# Patient Record
Sex: Female | Born: 1935 | Race: White | Hispanic: No | Smoking: Never smoker
Health system: Southern US, Community
[De-identification: ages and names within clinical notes are randomized; demographics above are authoritative.]

## PROBLEM LIST (undated history)

## (undated) DIAGNOSIS — K922 Gastrointestinal hemorrhage, unspecified: Secondary | ICD-10-CM

## (undated) DIAGNOSIS — Z9981 Dependence on supplemental oxygen: Secondary | ICD-10-CM

## (undated) DIAGNOSIS — F03A0 Unspecified dementia, mild, without behavioral disturbance, psychotic disturbance, mood disturbance, and anxiety: Secondary | ICD-10-CM

## (undated) DIAGNOSIS — I10 Essential (primary) hypertension: Secondary | ICD-10-CM

## (undated) DIAGNOSIS — C7800 Secondary malignant neoplasm of unspecified lung: Secondary | ICD-10-CM

## (undated) DIAGNOSIS — C189 Malignant neoplasm of colon, unspecified: Secondary | ICD-10-CM

## (undated) DIAGNOSIS — F32A Depression, unspecified: Secondary | ICD-10-CM

## (undated) DIAGNOSIS — K621 Rectal polyp: Secondary | ICD-10-CM

## (undated) DIAGNOSIS — I071 Rheumatic tricuspid insufficiency: Secondary | ICD-10-CM

## (undated) DIAGNOSIS — Z66 Do not resuscitate: Secondary | ICD-10-CM

## (undated) DIAGNOSIS — F0390 Unspecified dementia without behavioral disturbance: Secondary | ICD-10-CM

## (undated) DIAGNOSIS — I4821 Permanent atrial fibrillation: Secondary | ICD-10-CM

## (undated) DIAGNOSIS — I872 Venous insufficiency (chronic) (peripheral): Secondary | ICD-10-CM

## (undated) DIAGNOSIS — I340 Nonrheumatic mitral (valve) insufficiency: Secondary | ICD-10-CM

## (undated) DIAGNOSIS — Z9289 Personal history of other medical treatment: Secondary | ICD-10-CM

## (undated) DIAGNOSIS — I272 Pulmonary hypertension, unspecified: Secondary | ICD-10-CM

## (undated) DIAGNOSIS — F329 Major depressive disorder, single episode, unspecified: Secondary | ICD-10-CM

## (undated) HISTORY — DX: Unspecified dementia, mild, without behavioral disturbance, psychotic disturbance, mood disturbance, and anxiety: F03.A0

## (undated) HISTORY — DX: Rheumatic tricuspid insufficiency: I07.1

## (undated) HISTORY — DX: Unspecified dementia without behavioral disturbance: F03.90

## (undated) HISTORY — DX: Personal history of other medical treatment: Z92.89

## (undated) HISTORY — DX: Venous insufficiency (chronic) (peripheral): I87.2

## (undated) HISTORY — DX: Major depressive disorder, single episode, unspecified: F32.9

## (undated) HISTORY — PX: TONSILLECTOMY: SUR1361

## (undated) HISTORY — DX: Nonrheumatic mitral (valve) insufficiency: I34.0

## (undated) HISTORY — DX: Depression, unspecified: F32.A

---

## 2000-05-20 ENCOUNTER — Encounter: Admission: RE | Admit: 2000-05-20 | Discharge: 2000-05-20 | Payer: Self-pay | Admitting: Orthopedic Surgery

## 2000-05-20 ENCOUNTER — Encounter: Payer: Self-pay | Admitting: Orthopedic Surgery

## 2003-04-27 ENCOUNTER — Encounter: Admission: RE | Admit: 2003-04-27 | Discharge: 2003-04-27 | Payer: Self-pay | Admitting: Orthopedic Surgery

## 2003-04-27 ENCOUNTER — Encounter: Payer: Self-pay | Admitting: Orthopedic Surgery

## 2004-10-28 HISTORY — PX: CHOLECYSTECTOMY: SHX55

## 2011-07-17 ENCOUNTER — Other Ambulatory Visit (HOSPITAL_COMMUNITY): Payer: Self-pay | Admitting: Internal Medicine

## 2011-07-17 DIAGNOSIS — R599 Enlarged lymph nodes, unspecified: Secondary | ICD-10-CM

## 2011-07-19 ENCOUNTER — Other Ambulatory Visit (HOSPITAL_COMMUNITY): Payer: Self-pay

## 2011-07-23 ENCOUNTER — Ambulatory Visit (HOSPITAL_COMMUNITY)
Admission: RE | Admit: 2011-07-23 | Discharge: 2011-07-23 | Disposition: A | Discharge status: Home / Self Care | Payer: Medicare Other | Source: Ambulatory Visit | Attending: Internal Medicine | Admitting: Internal Medicine

## 2011-07-23 DIAGNOSIS — R599 Enlarged lymph nodes, unspecified: Secondary | ICD-10-CM

## 2011-07-23 DIAGNOSIS — R220 Localized swelling, mass and lump, head: Secondary | ICD-10-CM | POA: Insufficient documentation

## 2011-09-10 ENCOUNTER — Other Ambulatory Visit (HOSPITAL_COMMUNITY)
Admission: RE | Admit: 2011-09-10 | Discharge: 2011-09-10 | Disposition: A | Discharge status: Home / Self Care | Payer: Medicare Other | Source: Ambulatory Visit | Attending: Obstetrics and Gynecology | Admitting: Obstetrics and Gynecology

## 2011-09-10 DIAGNOSIS — Z124 Encounter for screening for malignant neoplasm of cervix: Secondary | ICD-10-CM | POA: Insufficient documentation

## 2011-10-29 HISTORY — PX: TOTAL KNEE ARTHROPLASTY: SHX125

## 2012-03-16 ENCOUNTER — Ambulatory Visit (HOSPITAL_COMMUNITY)
Admission: RE | Admit: 2012-03-16 | Discharge: 2012-03-16 | Disposition: A | Discharge status: Home / Self Care | Payer: Medicare Other | Source: Ambulatory Visit | Attending: Internal Medicine | Admitting: Internal Medicine

## 2012-03-16 DIAGNOSIS — R262 Difficulty in walking, not elsewhere classified: Secondary | ICD-10-CM | POA: Insufficient documentation

## 2012-03-16 DIAGNOSIS — M6281 Muscle weakness (generalized): Secondary | ICD-10-CM | POA: Insufficient documentation

## 2012-03-16 DIAGNOSIS — M25569 Pain in unspecified knee: Secondary | ICD-10-CM | POA: Insufficient documentation

## 2012-03-16 DIAGNOSIS — IMO0001 Reserved for inherently not codable concepts without codable children: Secondary | ICD-10-CM | POA: Insufficient documentation

## 2012-03-16 NOTE — Evaluation (Signed)
Physical Therapy Evaluation  Patient Details  Name: Rama Sorci MRN: [id number] Date of Birth: 1934-06-11  Today's Date: 03/16/2012 Time: 1520-1610 PT Time Calculation (min): 50 min  Visit#: 1  of 8   Re-eval: 04/15/12 Assessment Diagnosis: Hx of falls, difficulty walking Prior Therapy: HH until the end of March.  Authorization: Medicare  Authorization Time Period:    Authorization Visit#:   of     Past Medical History: No past medical history on file. Past Surgical History: No past surgical history on file.  Subjective Symptoms/Limitations Symptoms: Ms. Riesen states that she had total knee surgery February 26th and was doing pretty well.  She had therapy until the end of March through home health.  She states she was doing well until the last month.  She is now having difficulty getting up or down and if she does to much she falls.  She is currently using a cane.  Prior to the surgery she was not using a cane.  She states she does not have much pain she just feels like she has weakness.  She is being referred to PT to improve her safety and functional ability. How long can you stand comfortably?: She is able to stand for ten to fifteen minutes. How long can you walk comfortably?: She is currently walking with a cane and is able to walk for ten minutes. Pain Assessment Currently in Pain?: No/denies  Precautions/Restrictions  Precautions Precautions: Fall  Prior Functioning  Home Living Lives With: Other (Comment) (Brother) Home Access: Stairs to enter Secretary/administrator of Steps: 2 Entrance Stairs-Rails: Right Home Layout: Two level;Laundry or work area in basement Prior Function Vocation: Retired Leisure: Hobbies-yes (Comment) Comments: Pt enjoyed walking (Enjoyed working at [employer] and walking)  Cognition/Observation Cognition Overall Cognitive Status: Appears within functional limits for tasks assessed   Assessment RLE Strength Right Hip Flexion: 3/5 Right Hip  Extension: 3/5 Right Hip ABduction: 3/5 Right Hip ADduction: 3/5 Right Knee Flexion: 5/5 Right Knee Extension: 5/5 Right Ankle Dorsiflexion: 3/5 LLE Strength Left Hip Flexion: 4/5 Left Hip Extension: 4/5 Left Hip ABduction: 3/5 Left Hip ADduction: 3+/5 Left Knee Flexion: 3+/5 Left Knee Extension: 5/5 Left Ankle Dorsiflexion: 3/5  Exercise/Treatments Mobility/Balance  Berg Balance Test Total Score: 39/56      Exercises   Supine Hip Adduction Isometric: 5 reps Bridges: 5 reps Straight Leg Raises: 5 reps Sidelying Hip ABduction: 5 reps Prone  Hamstring Curl: 5 reps Hip Extension: 5 reps    Physical Therapy Assessment and Plan PT Assessment and Plan Clinical Impression Statement: Pt with history of falling who has decreased leg strength and decreased balance who will benefit from skilled PT to improve safety and quality of life. Pt will benefit from skilled therapeutic intervention in order to improve on the following deficits: Abnormal gait;Decreased balance;Difficulty walking;Decreased activity tolerance;Decreased strength Rehab Potential: Good PT Frequency: Min 2X/week PT Duration: 4 weeks PT Treatment/Interventions: Gait training;Therapeutic activities;Therapeutic exercise;Neuromuscular re-education PT Plan: begin Bike, tandem gait, retro gt, SLS, rockerboard, cone rotation on foam, heel raises and minisquats next treatment progress to balance beam, hurdles ....    Goals Home Exercise Program Pt will Perform Home Exercise Program: Independently PT Short Term Goals Time to Complete Short Term Goals: 2 weeks PT Short Term Goal 1: Pt to be able to walk for 25 minutes without stopping PT Long Term Goals Time to Complete Long Term Goals: 4 weeks PT Long Term Goal 1: able  to ambulate without a cane PT Long Term Goal 2:  able to walk for 40 minutes without stopping Long Term Goal 3: Pt to be able to get out into her yard without feeling fearful Long Term Goal 4:  patient to be able to come sit to stand 6 times in 30 seconds. PT Long Term Goal 5: Pt strength to be increased by one grade. Additional PT Long Term Goals?: Yes PT Long Term Goal 6: Pt to have not fallen in 2 weeks.  Problem List There is no problem list on file for this patient.   PT - End of Session Equipment Utilized During Treatment: Gait belt Activity Tolerance: Patient tolerated treatment well General Behavior During Session: [facility] for tasks performed Cognition: [facility] for tasks performed PT Plan of Care PT Home Exercise Plan: given Consulted and Agree with Plan of Care: Patient  GP  Functional Reporting Modifier  Current Status  [phone] - Mobility: Walking & Moving Around CK - At least 40% but less than 60% impaired, limited or restricted  Goal Status   G8979 - Mobility: [place] & Moving Around CI - At least 1% but less than 20% impaired, limited or restricted  I chose [phone] due to the fact that the patient is having trouble walking. CK based partly on mm testing of 3/5; partly due to hx of falling using cane and due to berg.  CI due to pt age.  IRUSSELL,CINDY  03/16/2012, 4:42 PM  Physician Documentation Your signature is required to indicate approval of the treatment plan as stated above.  Please sign and either send electronically or make a copy of this report for your files and return this physician signed original.   Please mark one 1.__approve of plan  2. ___approve of plan with the following conditions.   ______________________________                                                          _____________________ Physician Signature                                                                                                             Date

## 2012-03-19 ENCOUNTER — Ambulatory Visit (HOSPITAL_COMMUNITY)
Admission: RE | Admit: 2012-03-19 | Discharge: 2012-03-19 | Disposition: A | Discharge status: Home / Self Care | Payer: Medicare Other | Source: Ambulatory Visit | Attending: Internal Medicine | Admitting: Internal Medicine

## 2012-03-19 NOTE — Progress Notes (Signed)
Physical Therapy Treatment Patient Details  Name: Tamara Mitchell MRN: [id number] Date of Birth: April 01, 1934  Today's Date: 03/19/2012 Time: 7253-6644 PT Time Calculation (min): 50 min  Visit#: 2  of 8   Re-eval: 04/15/12 Charges: Therex x 10' NMR x 30'  Authorization: Medicare    Subjective: Symptoms/Limitations Symptoms: Pt reports HEP compliance. Pain Assessment Currently in Pain?: Yes Pain Score:   4 Pain Location: Knee Pain Orientation: Left   Exercise/Treatments Aerobic Stationary Bike: 8'@2 .0 seat 7 Standing Heel Raises: 10 reps;Limitations Heel Raises Limitations: Toe raises x 10 Functional Squat: 10 reps SLS: Unable to remove hands; 10" B with 1 finger assisst Other Standing Knee Exercises: Standing with feet together; standing with feet together w/eyes closed x 30"  Balance Exercises Stationary Bike: 8'@2 .0 seat 7 Tandem Walking: Limitations Tandem Walking Limitations: Attempted but unable this session Rotation with Cones: 1RT on foam Tandem Stance: Limitations Tandem Stance Limitations: unable to hold true tandem stance w/o assist; Widened tandem stance x30" B  Physical Therapy Assessment and Plan PT Assessment and Plan Clinical Impression Statement: Pt ambulates with wide BOS secondary to impaired balance. Attempted tandem gait but pt is unable to complete this activity at this point. Began tandem stance but pt unable to maintain without assistance. Pt is without complaint throughout session PT Plan: Continue to progress strength and balance per PT POC.     Problem List There is no problem list on file for this patient.   PT - End of Session Activity Tolerance: Patient tolerated treatment well General Behavior During Session: [facility] for tasks performed Cognition: [facility] for tasks performed  Seth Bake, PTA 03/19/2012, 5:57 PM

## 2012-03-24 ENCOUNTER — Ambulatory Visit (HOSPITAL_COMMUNITY)
Admission: RE | Admit: 2012-03-24 | Discharge: 2012-03-24 | Disposition: A | Discharge status: Home / Self Care | Payer: Medicare Other | Source: Ambulatory Visit | Attending: Internal Medicine | Admitting: Internal Medicine

## 2012-03-24 NOTE — Progress Notes (Signed)
Physical Therapy Treatment Patient Details  Name: Tamara Mitchell MRN: [id number] Date of Birth: September 26, 1934  Today's Date: 03/24/2012 Time: 4098-1191 PT Time Calculation (min): 40 min  Visit#: 3  of 8   Re-eval: 04/15/12 Charges: NMR x 8' Therex x 22'  Subjective: Symptoms/Limitations Symptoms: I left my cane in the car on purpose. Pain Assessment Currently in Pain?: No/denies   Exercise/Treatments Aerobic Stationary Bike: 8'@2 .0 seat 7 Standing Heel Raises: 15 reps Heel Raises Limitations: Toe raises x 15 Functional Squat: 10 reps Seated Other Seated Knee Exercises: sit to stand x 10 w/o UE assist Supine Hip Adduction Isometric: 10 reps Bridges: 10 reps Straight Leg Raises: 10 reps Sidelying Hip ABduction: 10 reps Prone  Hamstring Curl: 10 reps Hip Extension: 15 reps   Balance Exercises Stationary Bike: 8'@2 .0 seat 7 Tandem Stance: Limitations Tandem Stance Limitations: true tandem with min-mod assist x 30" B  Physical Therapy Assessment and Plan PT Assessment and Plan Clinical Impression Statement: Pt ambulates into therapy without and AD. Pt reports that she left her cane in the car on purpose. Pt advised to use her cane anytime she is going out of the house due to her lack of balance. Pt able to achieve tandem stance with increased ease this session but still requires min-mod assist to maintain. Pt is without complaint throughout session. PT Plan: Continue to progress per PT POC.     Problem List There is no problem list on file for this patient.   PT - End of Session Activity Tolerance: Patient tolerated treatment well General Behavior During Session: [facility] for tasks performed Cognition: [facility] for tasks performed   Seth Bake, PTA 03/24/2012, 5:06 PM

## 2012-03-26 ENCOUNTER — Ambulatory Visit (HOSPITAL_COMMUNITY)
Admission: RE | Admit: 2012-03-26 | Discharge: 2012-03-26 | Disposition: A | Discharge status: Home / Self Care | Payer: Medicare Other | Source: Ambulatory Visit | Attending: Internal Medicine | Admitting: Internal Medicine

## 2012-03-26 NOTE — Progress Notes (Signed)
Physical Therapy Treatment Patient Details  Name: Kemani Heidel MRN: [id number] Date of Birth: 04-14-1934  Today's Date: 03/26/2012 Time: 2130-8657 PT Time Calculation (min): 36 min Visit#: 4  of 8   Re-eval: 04/15/12 Authorization: Medicare  Charges:  NMR 12', therex 18'  Subjective: Symptoms/Limitations Symptoms: Pt. reports no pain or difficulties. Pain Assessment Currently in Pain?: No/denies   Exercise/Treatments Aerobic Stationary Bike: 6'@3 .0 seat 6 Standing Heel Raises: 15 reps Heel Raises Limitations: Toe raises x 15 Functional Squat: 15 reps SLS: max of 3:  R:3", L:2" Other Standing Knee Exercises: true tandem 30" each  Seated Other Seated Knee Exercises: sit to stand x 10 w/o UE assist Supine Straight Leg Raises: 10 reps;Both Sidelying Hip ABduction: 15 reps;Both Prone  Hamstring Curl: 15 reps Hip Extension: 15 reps     Physical Therapy Assessment and Plan PT Assessment and Plan Clinical Impression Statement: Able to complete all activities today with only 1 resting break.  Pt. able to maintain brief balance with SLS for B LE's.  Pt. requires cues to perform exercises slowly and controlled. PT Plan: Progress balance activities; attempt tandem, retro and side stepping next visit without AD.     PT - End of Session Equipment Utilized During Treatment: Gait belt Activity Tolerance: Patient tolerated treatment well General Behavior During Session: [facility] for tasks performed Cognition: [facility] for tasks performed  GP No functional reporting required  Lurena Nida, PTA/CLT 03/26/2012, 4:00 PM

## 2012-03-31 ENCOUNTER — Ambulatory Visit (HOSPITAL_COMMUNITY)
Admission: RE | Admit: 2012-03-31 | Discharge: 2012-03-31 | Disposition: A | Discharge status: Home / Self Care | Payer: Medicare Other | Source: Ambulatory Visit | Attending: Internal Medicine | Admitting: Internal Medicine

## 2012-03-31 DIAGNOSIS — M25569 Pain in unspecified knee: Secondary | ICD-10-CM | POA: Insufficient documentation

## 2012-03-31 DIAGNOSIS — IMO0001 Reserved for inherently not codable concepts without codable children: Secondary | ICD-10-CM | POA: Insufficient documentation

## 2012-03-31 DIAGNOSIS — R262 Difficulty in walking, not elsewhere classified: Secondary | ICD-10-CM | POA: Insufficient documentation

## 2012-03-31 DIAGNOSIS — M6281 Muscle weakness (generalized): Secondary | ICD-10-CM | POA: Insufficient documentation

## 2012-03-31 NOTE — Progress Notes (Signed)
Physical Therapy Treatment Patient Details  Name: Tamara Mitchell MRN: [id number] Date of Birth: 1934-07-15  Today's Date: 03/31/2012 Time: 7829-5621 PT Time Calculation (min): 41 min Charge: NMR 30 min therex 11 min  Visit#: 5  of 8   Re-eval: 04/15/12 Assessment Diagnosis: Hx of falls, difficulty walking Prior Therapy: HH until the end of March.  Authorization: Medicare  Authorization Time Period: G code at initial eval current status: CK, goal status: CI  Authorization Visit#: 5  of 8    Subjective: Symptoms/Limitations Symptoms: Pt entered dept ambulating with SPC with big smile on face, reported she vaccumed carpet yesterday and was suprised by how good she did with activity.  No pain today. Pain Assessment Currently in Pain?: No/denies  Precautions/Restrictions  Precautions Precautions: Fall  Exercise/Treatments Aerobic Stationary Bike: 6'@3 .5 seat 6 Standing Heel Raises: 20 reps;Limitations Heel Raises Limitations: Toe raises x 20 Functional Squat: 15 reps;Limitations Functional Squat Limitations: manual cueing for correct mechanics/form and vc-ing to slow down SLS: max of 3:  R:3", L:2" Seated Other Seated Knee Exercises: sit to stand x 10 w/o UE assist Other Seated Knee Exercises: Heel rolling out 10x10"  Balance Exercises Stationary Bike: 6'@3 .5 seat 6 Tandem Walking: 1 round trip;Limitations Tandem Walking Limitations: Mod assistance with several LOB episodes, unable to complete true tandem gait without tapping sides Tandem Stance: Limitations Tandem Stance Limitations: true tandem with min-mod assist 3 x 30" B  Physical Therapy Assessment and Plan PT Assessment and Plan Clinical Impression Statement: Session focus on improving balance. Noted decreased proprioception, pt. required visual cueing to know when in tandem stance, pt was able to tandem stance independently with min assistance with LOB episdoes.  Progressed to tandem gait with max difficulty, pt unable  to tandem gait without tapping LE on sides, vc-ing required constantly to look up.  Therex required cueing to perform exercises with correct form, slowly and controlled.  Added heels out to increase hip IR strength to improve gait mechanics. PT Plan: Continue with balance training with tandem stance, tandem gait, retro gait and add side stepping next visit with no AD.    Goals    Problem List There is no problem list on file for this patient.   PT - End of Session Equipment Utilized During Treatment: Gait belt Activity Tolerance: Patient tolerated treatment well General Behavior During Session: [facility] for tasks performed Cognition: [facility] for tasks performed  GP No functional reporting required  Juel Burrow, PTA 03/31/2012, 4:15 PM

## 2012-04-02 ENCOUNTER — Ambulatory Visit (HOSPITAL_COMMUNITY)
Admission: RE | Admit: 2012-04-02 | Discharge: 2012-04-02 | Disposition: A | Discharge status: Home / Self Care | Payer: Medicare Other | Source: Ambulatory Visit | Attending: Internal Medicine | Admitting: Internal Medicine

## 2012-04-02 NOTE — Progress Notes (Signed)
Physical Therapy Treatment Patient Details  Name: Tamara Mitchell MRN: [id number] Date of Birth: Mar 22, 1934  Today's Date: 04/02/2012 Time: 1523-1608 PT Time Calculation (min): 45 min  Visit#: 6  of 8   Re-eval: 04/15/12    Authorization: medicare  Authorization Time Period: eval G code CK goal CI  Authorization Visit#:   of     Subjective: Symptoms/Limitations Symptoms: Pt states she has been doing her exercises at home without difficulty     Exercise/Treatments Balance Exercises Standing Tandem Stance: Eyes open;3 reps;30 secs Balance Beam: x2 RT Retro Gait: 2 reps Sidestepping: 2 reps Numbers 1-15: 1 rep Marching: 10 reps Heel Raises: 15 reps Sit to Stand: Standard surface;Limitations Sit to Stand Limitations: 5x   Physical Therapy Assessment and Plan PT Assessment and Plan Clinical Impression Statement: Pt continues to relax into her Y ligament with shoulders behind her base of support.  Worked on standing with shoulders in line with hips.  Added multiple balance activities with [facility] assist needed for balance. Rehab Potential: Good PT Frequency: Min 2X/week PT Plan: Begin cone rotation next treatment...may hold bike to last so there is enough time to complete activites.    Goals   Problem List There is no problem list on file for this patient.  PT - End of Session Equipment Utilized During Treatment: Gait belt Activity Tolerance: Patient limited by fatigue (Pt needed to take 3 short resting breaks with therapy sessio) General Behavior During Session: [facility] for tasks performed Cognition: [facility] for tasks performed  GP No functional reporting required  Shanigua Gibb,CINDY 04/02/2012, 4:13 PM

## 2012-04-07 ENCOUNTER — Ambulatory Visit (HOSPITAL_COMMUNITY)
Admission: RE | Admit: 2012-04-07 | Discharge: 2012-04-07 | Disposition: A | Discharge status: Home / Self Care | Payer: Medicare Other | Source: Ambulatory Visit | Attending: Internal Medicine | Admitting: Internal Medicine

## 2012-04-07 NOTE — Progress Notes (Signed)
Physical Therapy Treatment Patient Details  Name: Tamara Mitchell MRN: [id number] Date of Birth: 06/01/1934  Today's Date: 04/07/2012 Time: 1308-6578 PT Time Calculation (min): 45 min  Visit#: 7  of 8   Re-eval: 04/15/12 Charges: NMR x 39'  Authorization: medicare  Authorization Time Period: eval G code CK goal CI  Authorization Visit#: 7  of 8    Subjective: Symptoms/Limitations Symptoms: I'm not having any pain. I just feel weak all over. I'm afraid I'm not getting any better. Pain Assessment Currently in Pain?: No/denies  Exercise/Treatments  Balance Exercises Standing Tandem Stance: 3 reps;30 secs;Eyes open Tandem Gait: 2 reps;Forward;Limitations Tandem Gait Limitations: B HHA Retro Gait: 2 reps Numbers 1-15: 1 rep;Balance Beam Heel Raises: 15 reps Sit to Stand: Standard surface Sit to Stand Limitations: 5x w/o UE assist with vc's for looking up   Physical Therapy Assessment and Plan PT Assessment and Plan Clinical Impression Statement: Pt comes into therapy very discouraged about her progress. Pt educated on the time it takes to improve balance. Pt able to maintain tandem stance with CGA for 30" at a time. This is a huge improvement for pt. Pt realized  ow well she did with this activity and seemed to become more confident. Pt completes all therex with improved proprioceptive awareness. Pt continues to require multimodal cueing for posture. At end of session pt reports:" I feel more confident in myself. I can really tell a difference now." PT Plan: Continue to progress balance per PT POC. Begin cone rotation next session.     Problem List There is no problem list on file for this patient.   PT - End of Session Activity Tolerance: Patient tolerated treatment well General Behavior During Session: [facility] for tasks performed Cognition: [facility] for tasks performed   Seth Bake, PTA 04/07/2012, 4:29 PM

## 2012-04-10 ENCOUNTER — Ambulatory Visit (HOSPITAL_COMMUNITY)
Admission: RE | Admit: 2012-04-10 | Discharge: 2012-04-10 | Disposition: A | Discharge status: Home / Self Care | Payer: Medicare Other | Source: Ambulatory Visit | Attending: Internal Medicine | Admitting: Internal Medicine

## 2012-04-10 NOTE — Progress Notes (Signed)
Physical Therapy Re-evaluation  Patient Details  Name: Tamara Mitchell MRN: [id number] Date of Birth: 15-Nov-1933  Today's Date: 04/10/2012 Time: 1520-1600 PT Time Calculation (min): 40 min  Visit#: 8  of 16   Re-eval: 05/08/12 Charges: PPT x 20' MMT x 1 Self care x 10'   Authorization: medicare  Authorization Visit#: 8  of 16    Past Medical History: No past medical history on file. Past Surgical History: No past surgical history on file.  Subjective Symptoms/Limitations Symptoms: I feel like my balance has improved and I feel more sure of myself. Pain Assessment Currently in Pain?: No/denies   Assessment RLE Strength Right Hip Flexion:  (4+/5 was 3/5) Right Hip Extension: 4/5 (was 3/5) Right Hip ABduction: 4/5 Right Hip ADduction: 4/5 (was 3/5) Right Knee Flexion: 5/5 Right Knee Extension: 5/5 Right Ankle Dorsiflexion: 5/5 (was 3/5) LLE Strength Left Hip Flexion: 5/5 (was 4/5) Left Hip Extension:  (4+/5 was 4/5) Left Hip ABduction:  (4+/5 was 3/5) Left Hip ADduction: 4/5 (3+/5) Left Knee Flexion: 4/5 (was 3+/5) Left Knee Extension: 5/5 Left Ankle Dorsiflexion: 5/5 (was 3/5)  Exercise/Treatments Mobility/Balance  Berg Balance Test Sit to Stand: Able to stand without using hands and stabilize independently Standing Unsupported: Able to stand safely 2 minutes Sitting with Back Unsupported but Feet Supported on Floor or Stool: Able to sit safely and securely 2 minutes Stand to Sit: Sits safely with minimal use of hands Transfers: Able to transfer safely, minor use of hands Standing Unsupported with Eyes Closed: Able to stand 10 seconds with supervision Standing Ubsupported with Feet Together: Able to place feet together independently and stand for 1 minute with supervision From Standing, Reach Forward with Outstretched Arm: Can reach forward >12 cm safely (5") From Standing Position, Pick up Object from Floor: Able to pick up shoe safely and easily From Standing  Position, Turn to Look Behind Over each Shoulder: Looks behind from both sides and weight shifts well Turn 360 Degrees: Able to turn 360 degrees safely but slowly Standing Unsupported, Alternately Place Feet on Step/Stool: Needs assistance to keep from falling or unable to try Standing Unsupported, One Foot in Front: Able to plae foot ahead of the other independently and hold 30 seconds Standing on One Leg: Unable to try or needs assist to prevent fall Total Score: 42     Physical Therapy Assessment and Plan PT Assessment and Plan Clinical Impression Statement: Pt has progressed well with therapy but continues to present with gait and balance deficits. Pt's strength and BERG score have improved. Pt feels that she has improved 70% since beginning therapy/ Pt would benefit from continuing therapy to reduce risk of falls and improve QOL. PT Plan: Recommend to PT to continue 2x/wk x 4 wks.    Goals Home Exercise Program Pt will Perform Home Exercise Program: Independently PT Short Term Goals Time to Complete Short Term Goals: 2 weeks PT Short Term Goal 1: Pt to be able to walk for 25 minutes without stopping PT Short Term Goal 1 - Progress: Progressing toward goal (Pt can walk no longer than 10') PT Long Term Goals Time to Complete Long Term Goals: 4 weeks PT Long Term Goal 1: able to ambulate without a cane PT Long Term Goal 1 - Progress: Progressing toward goal PT Long Term Goal 2: able to walk for 40 minutes without stopping PT Long Term Goal 2 - Progress: Progressing toward goal Long Term Goal 3: Pt to be able to get out into her yard  without feeling fearful Long Term Goal 3 Progress: Met (Pt is no longer fearful as long as she has her cane.) Long Term Goal 4: patient to be able to come sit to stand 6 times in 30 seconds. Long Term Goal 4 Progress: Met (Able to complete 6 STS w/o UE assist in 24".) PT Long Term Goal 5: Pt strength to be increased by one grade. Long Term Goal 5  Progress: Partly met PT Long Term Goal 6: Pt to have not fallen in 2 weeks.  (Met)  Problem List There is no problem list on file for this patient.   PT - End of Session Activity Tolerance: Patient tolerated treatment well General Behavior During Session: [facility] for tasks performed Cognition: [facility] for tasks performed  GP  Functional Reporting Modifier  Current Status  [phone] - Mobility: Walking & Moving Around CJ - At least 20% but less than 40% impaired, limited or restricted  Goal Status  G8979 - Mobility: [place] & Moving Around CI - At least 1% but less than 20% impaired, limited or restricted  Based on MMT and self report.  Antonieta Iba 04/10/2012, 4:53 PM  Physician Documentation Your signature is required to indicate approval of the treatment plan as stated above.  Please sign and either send electronically or make a copy of this report for your files and return this physician signed original.   Please mark one 1.__approve of plan  2. ___approve of plan with the following conditions.   ______________________________                                                          _____________________ Physician Signature                                                                                                             Date

## 2012-04-14 ENCOUNTER — Ambulatory Visit (HOSPITAL_COMMUNITY)
Admission: RE | Admit: 2012-04-14 | Discharge: 2012-04-14 | Disposition: A | Discharge status: Home / Self Care | Payer: Medicare Other | Source: Ambulatory Visit | Attending: Internal Medicine | Admitting: Internal Medicine

## 2012-04-14 NOTE — Progress Notes (Signed)
Physical Therapy Treatment Patient Details  Name: Tamara Mitchell MRN: [id number] Date of Birth: 06/08/1934  Today's Date: 04/14/2012 Time: 1305-1350 PT Time Calculation (min): 45 min  Visit#: 9  of 16   Re-eval: 05/08/12 Charges: Gait x 10' NMR x 30'   Authorization: medicare  Authorization Time Period:    Authorization Visit#: 9  of 16    Subjective: Symptoms/Limitations Symptoms: I'm feeling good but I still feel a little weak. Pain Assessment Currently in Pain?: No/denies   Exercise/Treatments Aerobic Tread Mill: .8x5' with min assist and cueing for stride length and heel-toe pattern Standing Functional Squat: 15 reps Gait Training: in dept with SPC working on posture and narrowing BOS Standing Tandem Stance: 2 reps;30 secs Tandem Gait: 2 reps;Forward;Limitations Tandem Gait Limitations: B HHA Heel Raises: 15 reps;Limitations Heel Raises Limitations: w/o UE assist Toe Raise: 15 reps   Physical Therapy Assessment and Plan PT Assessment and Plan Clinical Impression Statement: Tx focus on improving posture and gait. Began gait training with min assist on TM with constant cueing to increase stride length and put heel down first. Pt presents with trunk extension from weak core. Pt is able to bring pelvis to neutral with cueing to facilitate abdominal contraction but pt is unable to hold this position for long periods of time. Pt reports 0/10 pain at end of session. Rehab Potential: Good PT Frequency: Min 2X/week PT Duration: 4 weeks PT Treatment/Interventions: Gait training;Stair training;Functional mobility training;Therapeutic activities;Therapeutic exercise;Balance training;Patient/family education;Neuromuscular re-education PT Plan: Continue to progress balance, gait, and strength per PT POC.      Problem List There is no problem list on file for this patient.   PT - End of Session Activity Tolerance: Patient tolerated treatment well General Behavior During Session:  [facility] for tasks performed Cognition: [facility] for tasks performed   Seth Bake, PTA 04/14/2012, 2:53 PM

## 2012-04-16 ENCOUNTER — Ambulatory Visit (HOSPITAL_COMMUNITY)
Admission: RE | Admit: 2012-04-16 | Discharge: 2012-04-16 | Disposition: A | Discharge status: Home / Self Care | Payer: Medicare Other | Source: Ambulatory Visit | Attending: Internal Medicine | Admitting: Internal Medicine

## 2012-04-16 NOTE — Progress Notes (Signed)
Physical Therapy Treatment Patient Details  Name: Tamara Mitchell MRN: [id number] Date of Birth: 1934-06-24  Today's Date: 04/16/2012 Time: 1520-1605 PT Time Calculation (min): 45 min  Visit#: 10  of 16   Re-eval: 05/08/12  Charge: gait 23 min NMR 20 min  Authorization: medicare  Authorization Time Period: eval G code CK goal CI   Authorization Visit#: 10  of 16    Subjective: Symptoms/Limitations Symptoms: I'm feeling good today, do feel like I am getting stronger and it's easier to get around the house.  Would like to be able to take my trashcan to the end of my long driveway and bring it back, my brother has been doing it currently.   Pain Assessment Currently in Pain?: No/denies  Objective:   Exercise/Treatments Aerobic Tread Mill: Gait trainer x 8 min @ .30-->.35 cyc/sec with max cueing to increase stride length, posture and heel to toe gait Standing Gait Training: in dept with SPC with mod cueing for proper sequenceing x 15 min Other Standing Knee Exercises: tandem stance 2x 30", 2 RT tandem gait with  Physical Therapy Assessment and Plan PT Assessment and Plan Clinical Impression Statement: This session focus on proper gait mechanics with SPC.  Max cueing required for proper sequence with SPC.  Began gait trainer on treadmill with constant cueing required to increase stride length and heel to toe gait.  Pt with improved gait mechanics noted following gait trainer and correct sequence with SPC at end of session. PT Plan: Continue to progress balance, gait, and strength per PT POC    Goals    Problem List There is no problem list on file for this patient.   PT - End of Session Equipment Utilized During Treatment: Gait belt Activity Tolerance: Patient tolerated treatment well General Behavior During Session: [facility] for tasks performed Cognition: [facility] for tasks performed  GP No functional reporting required  Juel Burrow, PTA 04/16/2012, 6:47 PM

## 2012-04-21 ENCOUNTER — Ambulatory Visit (HOSPITAL_COMMUNITY)
Admission: RE | Admit: 2012-04-21 | Discharge: 2012-04-21 | Disposition: A | Discharge status: Home / Self Care | Payer: Medicare Other | Source: Ambulatory Visit | Attending: Internal Medicine | Admitting: Internal Medicine

## 2012-04-21 NOTE — Progress Notes (Signed)
Physical Therapy Treatment Patient Details  Name: Tamara Mitchell MRN: [id number] Date of Birth: [birth year]  Today's Date: 04/21/2012 Time: 1515-1600 PT Time Calculation (min): 45 min  Visit#: 11  of 16   Re-eval: 05/08/12 Charges: Gait training x 23' Therex 17'    Subjective: Symptoms/Limitations Symptoms: Pt states that all of the exercises are difficulty for her but she knows that she is improving.  Pain Assessment Currently in Pain?: No/denies   Exercise/Treatments Aerobic Tread Mill: Gait trainer x 8 min @ .30-->.35 cyc/sec with max cueing to increase stride length, posture and heel to toe gait Standing Gait Training: in dept with SPC with mod cueing for proper sequenceing Seated Other Seated Knee Exercises: Pelvic tilts A/P R/L on sit disk x10 each Standing Tandem Gait: 1 rep;Retro Tandem Gait Limitations: B HHA Heel Raises: 15 reps;Limitations Heel Raises Limitations: w/o UE assist   Physical Therapy Assessment and Plan PT Assessment and Plan Clinical Impression Statement: Pt continues to require constant vc's to decrease width of BOS and keep pelvis in neutral. Began pelvic tilts on sit disk to improve pelvic mobility. Pt completes gait training on TM with vc's to increase stride length but requires decreased assistance. PT Plan: Continue to progress balance, gait, and strength per PT POC.     Problem List There is no problem list on file for this patient.   PT - End of Session Activity Tolerance: Patient tolerated treatment well General Behavior During Session: [facility] for tasks performed Cognition: [facility] for tasks performed  Seth Bake, PTA 04/21/2012, 5:17 PM

## 2012-04-23 ENCOUNTER — Ambulatory Visit (HOSPITAL_COMMUNITY)
Admission: RE | Admit: 2012-04-23 | Discharge: 2012-04-23 | Disposition: A | Discharge status: Home / Self Care | Payer: Medicare Other | Source: Ambulatory Visit | Attending: Internal Medicine | Admitting: Internal Medicine

## 2012-04-23 NOTE — Progress Notes (Signed)
Physical Therapy Treatment Patient Details  Name: Tamara Mitchell MRN: [id number] Date of Birth: 1934/03/07  Today's Date: 04/23/2012 Time: 7425-9563 PT Time Calculation (min): 46 min  Visit#: 12  of 16   Re-eval: 05/08/12 Charges: Gait/pre-gait x 15' NMR x 25'  Authorization: medicare   Authorization Time Period: eval G code CK goal CI  Authorization Visit#: 12  of 16    Subjective: Symptoms/Limitations Symptoms: Pt asks therapist is she walks with her legs far apart and why. Pain Assessment Currently in Pain?: No/denies   Exercise/Treatments Aerobic Tread Mill: Gait trainer x 5:55 @ .40 cyc/sec with max cueing to increase stride length, posture and heel to toe gait; pt became fatigued at 5:55 Standing Gait Training: in dept with SPC with mod cueing for proper sequenceing Other Standing Knee Exercises: trunk rotation to improve pelvic mobility Seated Other Seated Knee Exercises: Pelvic tilts A/P R/L on sit disk x10 each Standing Tandem Gait: 2 reps Tandem Gait Limitations: B HHA Retro Gait: 1 rep;Limitations Retro Gait Limitations: B HHA Sidestepping: 1 rep;Limitations Sidestepping Limitations: B HHA Heel Raises: 15 reps;Limitations Heel Raises Limitations: w/o UE assist Toe Raise: 15 reps   Physical Therapy Assessment and Plan PT Assessment and Plan Clinical Impression Statement: Pt presents with improved posture, narrower BOS, and longer stride length at end of session after gait training. Began functional squats to improve posture with max cueing for form. Pt is without complaint throughout session. PT Plan: Continue to progress balance, gait, and strength per PT POC.     Problem List There is no problem list on file for this patient.   PT - End of Session Activity Tolerance: Patient tolerated treatment well General Behavior During Session: [facility] for tasks performed Cognition: [facility] for tasks performed   Seth Bake, PTA 04/23/2012, 4:35 PM

## 2012-04-28 ENCOUNTER — Ambulatory Visit (HOSPITAL_COMMUNITY)
Admission: RE | Admit: 2012-04-28 | Discharge: 2012-04-28 | Disposition: A | Discharge status: Home / Self Care | Payer: Medicare Other | Source: Ambulatory Visit | Attending: Internal Medicine | Admitting: Internal Medicine

## 2012-04-28 DIAGNOSIS — IMO0001 Reserved for inherently not codable concepts without codable children: Secondary | ICD-10-CM | POA: Insufficient documentation

## 2012-04-28 DIAGNOSIS — M25569 Pain in unspecified knee: Secondary | ICD-10-CM | POA: Insufficient documentation

## 2012-04-28 DIAGNOSIS — R262 Difficulty in walking, not elsewhere classified: Secondary | ICD-10-CM | POA: Insufficient documentation

## 2012-04-28 DIAGNOSIS — M6281 Muscle weakness (generalized): Secondary | ICD-10-CM | POA: Insufficient documentation

## 2012-04-28 NOTE — Progress Notes (Signed)
Physical Therapy Treatment Patient Details  Name: Tamara Mitchell MRN: [id number] Date of Birth: November 09, 1933  Today's Date: 04/28/2012 Time: 7829-5621 PT Time Calculation (min): 32 min  Visit#: 13  of 16   Re-eval: 05/08/12 Charges: Gait x 10' NMR x 20'  Authorization: medicare   Authorization Time Period: eval G code CK goal CI  Authorization Visit#: 13  of 16    Subjective: Symptoms/Limitations Symptoms: I'm very tired today. I've been cleaning. Pain Assessment Currently in Pain?: No/denies   Exercise/Treatments Standing Tandem Gait: 2 reps Tandem Gait Limitations: 1 HHA Retro Gait: 1 rep;Limitations Sidestepping: 2 reps Other Standing Exercises: Gait training throughout dept to improve posture, stride length and narrowing BOS x 10'   Physical Therapy Assessment and Plan PT Assessment and Plan Clinical Impression Statement: Pt continues to displays gains in balance a stability although opt is easily fatigued this session. Pt requires frequent rest breaks throughout session. Improved gait mechanics noted at end of session. PT Plan: Continue to progress balance, gait, and strength per PT POC.     Problem List There is no problem list on file for this patient.   PT - End of Session Activity Tolerance: Patient tolerated treatment well General Behavior During Session: [facility] for tasks performed Cognition: [facility] for tasks performed   Seth Bake, PTA 04/28/2012, 6:00 PM

## 2012-05-01 ENCOUNTER — Ambulatory Visit (HOSPITAL_COMMUNITY)
Admission: RE | Admit: 2012-05-01 | Discharge: 2012-05-01 | Disposition: A | Discharge status: Home / Self Care | Payer: Medicare Other | Source: Ambulatory Visit | Attending: Internal Medicine | Admitting: Internal Medicine

## 2012-05-01 NOTE — Progress Notes (Signed)
Physical Therapy Treatment Patient Details  Name: Tamara Mitchell MRN: [id number] Date of Birth: [birth year]  Today's Date: 05/01/2012 Time: 7829-5621 PT Time Calculation (min): 41 min  Visit#: 14  of 16   Re-eval: 05/08/12   Authorization: Medicare   Subjective: Symptoms/Limitations Symptoms: Pt states she is doing alright.  States her balance is not so good.    Exercise/Treatments Balance Exercises Standing Tandem Stance: Eyes open;2 reps Wall Bumps: Shoulder;Hips;Eyes opened;10 reps Tandem Gait: 2 reps Tandem Gait Limitations: 1 HHA Retro Gait: 2 reps Cone Rotation: Foam Marching: 10 reps Sit to Stand: Standard surface Sit to Stand Limitations: x10 Other Standing Exercises: Gait training throughout dept to improve posture, stride length and narrowing BOS x 10'      Physical Therapy Assessment and Plan PT Assessment and Plan Clinical Impression Statement: Pt continues to lose balance with COG behind BOS began wall bumps to try and reinfroce pulling forward.  Worked on standing posutre with verbal cues. PT Plan: Continue to work on gait and balance      Problem List There is no problem list on file for this patient.   PT - End of Session Equipment Utilized During Treatment: Gait belt Activity Tolerance: Patient tolerated treatment well General Behavior During Session: [facility] for tasks performed Cognition: [facility] for tasks performed PT Plan of Care Consulted and Agree with Plan of Care: Patient  GP    RUSSELL,CINDY 05/01/2012, 4:50 PM

## 2012-05-05 ENCOUNTER — Ambulatory Visit (HOSPITAL_COMMUNITY): Payer: Medicare Other | Admitting: *Deleted

## 2012-05-12 ENCOUNTER — Ambulatory Visit (HOSPITAL_COMMUNITY)
Admission: RE | Admit: 2012-05-12 | Discharge: 2012-05-12 | Disposition: A | Discharge status: Home / Self Care | Payer: Medicare Other | Source: Ambulatory Visit | Attending: Internal Medicine | Admitting: Internal Medicine

## 2012-05-12 NOTE — Progress Notes (Signed)
Physical Therapy Re-evaluation  Patient Details  Name: Tamara Mitchell MRN: [id number] Date of Birth: May 25, 1934  Today's Date: 05/12/2012 Time: 1520-1600 PT Time Calculation (min): 40 min  Visit#: 15  of 16   Re-eval: 05/08/12 Charges: PPT x 15' MMT x 1 Self care x 10'  Authorization: Medicare   Past Medical History: No past medical history on file. Past Surgical History: No past surgical history on file.  Subjective Symptoms/Limitations Symptoms: Pt states she feels that her balance has improved 90%. Pain Assessment Currently in Pain?: No/denies   Assessment RLE Strength Right Hip Flexion:  (4+/5 was 4+/5) Right Hip Extension:  (4+/5) Right Hip ABduction: 5/5 Right Hip ADduction: 5/5 Right Knee Flexion: 5/5 Right Knee Extension: 5/5 Right Ankle Dorsiflexion: 5/5 LLE Strength Left Hip Flexion: 5/5 (was 5/5) Left Hip Extension:  (4+/5) Left Hip ABduction: 5/5 Left Hip ADduction: 5/5 Left Knee Flexion: 5/5 Left Knee Extension: 5/5 Left Ankle Dorsiflexion: 5/5  Exercise/Treatments Mobility/Balance  Berg Balance Test Sit to Stand: Able to stand without using hands and stabilize independently Standing Unsupported: Able to stand safely 2 minutes Sitting with Back Unsupported but Feet Supported on Floor or Stool: Able to sit safely and securely 2 minutes Stand to Sit: Sits safely with minimal use of hands Transfers: Able to transfer safely, minor use of hands Standing Unsupported with Eyes Closed: Able to stand 10 seconds safely Standing Ubsupported with Feet Together: Able to place feet together independently and stand 1 minute safely From Standing, Reach Forward with Outstretched Arm: Can reach confidently >25 cm (10") From Standing Position, Pick up Object from Floor: Able to pick up shoe safely and easily From Standing Position, Turn to Look Behind Over each Shoulder: Looks behind from both sides and weight shifts well Turn 360 Degrees: Able to turn 360 degrees safely  but slowly Standing Unsupported, Alternately Place Feet on Step/Stool: Able to complete >2 steps/needs minimal assist Standing Unsupported, One Foot in Front: Able to place foot tandem independently and hold 30 seconds Standing on One Leg: Unable to try or needs assist to prevent fall Total Score: 47     Physical Therapy Assessment and Plan PT Assessment and Plan Clinical Impression Statement: During prone MMT pt began to hold breath and her face became cyanotic. Pt was immediately asked to sit up. Face returned to normal color with rest. Pt was without complaint and did not even realize she was hollering her breath. Pt educated on the importance of breathing while exercising. Pt has progressed very well with therapy. Pt presents with improvements in balance and strength. Pt also displays improved posture and gait mechanics. Pt reports that feels her balance has improved 90% since initial eval. Pt states that she is comfortable with D/C to HEP PT Plan: Recommend D/C to HEP.    Goals Home Exercise Program Pt will Perform Home Exercise Program: Independently PT Short Term Goals Time to Complete Short Term Goals: 2 weeks PT Short Term Goal 1: Pt to be able to walk for 25 minutes without stopping PT Short Term Goal 1 - Progress: Progressing toward goal (Pt states she can walk 20' without stopping.) PT Long Term Goals Time to Complete Long Term Goals: 4 weeks PT Long Term Goal 1: able to ambulate without a cane PT Long Term Goal 1 - Progress: Partly met (Pt only uses can outside) PT Long Term Goal 2: able to walk for 40 minutes without stopping PT Long Term Goal 2 - Progress: Progressing toward goal Long Term Goal  3: Pt to be able to get out into her yard without feeling fearful Long Term Goal 3 Progress: Met Long Term Goal 4: Patient to be able to come sit to stand 6 times in 30 seconds. Long Term Goal 4 Progress: Met PT Long Term Goal 5: Pt strength to be increased by one grade. PT Long  Term Goal 6: Pt to have not fallen in 2 weeks.  (Met)  Problem List There is no problem list on file for this patient.   PT - End of Session Equipment Utilized During Treatment: Gait belt Activity Tolerance: Patient tolerated treatment well General Behavior During Session: [facility] for tasks performed Cognition: [facility] for tasks performed  GP Functional Limitation: Mobility: Walking and moving around Mobility: Walking and Moving Around Current Status ([id number]): At least 1 percent but less than 20 percent impaired, limited or restricted Mobility: Walking and Moving Around Goal Status [phone]): At least 1 percent but less than 20 percent impaired, limited or restricted Mobility: Walking and Moving Around Discharge Status [phone]): At least 1 percent but less than 20 percent impaired, limited or restricted Based on Berg, walking w/o an AD and self assessment.   Antonieta Iba 05/12/2012, 5:45 PM  Physician Documentation Your signature is required to indicate approval of the treatment plan as stated above.  Please sign and either send electronically or make a copy of this report for your files and return this physician signed original.   Please mark one 1.__approve of plan  2. ___approve of plan with the following conditions.   ______________________________                                                          _____________________ Physician Signature                                                                                                             Date

## 2012-05-14 ENCOUNTER — Ambulatory Visit (HOSPITAL_COMMUNITY): Payer: Medicare Other | Admitting: Physical Therapy

## 2012-05-19 ENCOUNTER — Ambulatory Visit (HOSPITAL_COMMUNITY): Payer: Medicare Other | Admitting: Physical Therapy

## 2012-05-21 ENCOUNTER — Ambulatory Visit (HOSPITAL_COMMUNITY): Payer: Medicare Other | Admitting: *Deleted

## 2012-05-26 ENCOUNTER — Ambulatory Visit (HOSPITAL_COMMUNITY): Payer: Medicare Other | Admitting: Physical Therapy

## 2012-10-24 HISTORY — PX: TRANSTHORACIC ECHOCARDIOGRAM: SHX275

## 2013-05-06 ENCOUNTER — Emergency Department (HOSPITAL_COMMUNITY)
Admission: EM | Admit: 2013-05-06 | Discharge: 2013-05-06 | Disposition: A | Discharge status: Home / Self Care | Payer: Medicare Other | Attending: Emergency Medicine | Admitting: Emergency Medicine

## 2013-05-06 ENCOUNTER — Emergency Department (HOSPITAL_COMMUNITY): Payer: Medicare Other

## 2013-05-06 ENCOUNTER — Encounter (HOSPITAL_COMMUNITY): Payer: Self-pay | Admitting: Emergency Medicine

## 2013-05-06 DIAGNOSIS — I10 Essential (primary) hypertension: Secondary | ICD-10-CM | POA: Insufficient documentation

## 2013-05-06 DIAGNOSIS — I4891 Unspecified atrial fibrillation: Secondary | ICD-10-CM | POA: Insufficient documentation

## 2013-05-06 DIAGNOSIS — R52 Pain, unspecified: Secondary | ICD-10-CM | POA: Insufficient documentation

## 2013-05-06 DIAGNOSIS — IMO0001 Reserved for inherently not codable concepts without codable children: Secondary | ICD-10-CM | POA: Insufficient documentation

## 2013-05-06 DIAGNOSIS — Z79899 Other long term (current) drug therapy: Secondary | ICD-10-CM | POA: Insufficient documentation

## 2013-05-06 DIAGNOSIS — R002 Palpitations: Secondary | ICD-10-CM | POA: Insufficient documentation

## 2013-05-06 LAB — CBC WITH DIFFERENTIAL/PLATELET
Basophils Absolute: 0.1 10*3/uL (ref 0.0–0.1)
Basophils Relative: 1 % (ref 0–1)
Eosinophils Relative: 2 % (ref 0–5)
HCT: 32.1 % — ABNORMAL LOW (ref 36.0–46.0)
Hemoglobin: 10.2 g/dL — ABNORMAL LOW (ref 12.0–15.0)
MCH: 27.2 pg (ref 26.0–34.0)
MCHC: 31.8 g/dL (ref 30.0–36.0)
MCV: 85.6 fL (ref 78.0–100.0)
Monocytes Absolute: 0.9 10*3/uL (ref 0.1–1.0)
Monocytes Relative: 10 % (ref 3–12)
RDW: 15.5 % (ref 11.5–15.5)

## 2013-05-06 LAB — BASIC METABOLIC PANEL
BUN: 16 mg/dL (ref 6–23)
CO2: 33 mEq/L — ABNORMAL HIGH (ref 19–32)
Calcium: 9.8 mg/dL (ref 8.4–10.5)
Creatinine, Ser: 0.58 mg/dL (ref 0.50–1.10)
GFR calc Af Amer: >90 mL/min (ref >90)

## 2013-05-06 LAB — TROPONIN I: Troponin I: <0.3 ng/mL (ref <0.30)

## 2013-05-06 NOTE — ED Provider Notes (Signed)
History  This chart was scribed for Flint Melter, MD by Bennett Scrape, ED Scribe. This patient was seen in room APA05/APA05 and the patient's care was started at 4:01 PM.  CSN: [id number]  Arrival date & time 05/06/13  1456   First MD Initiated Contact with Patient 05/06/13 1601     Chief Complaint  Patient presents with  . Generalized Body Aches  . Tachycardia    Patient is a 79 y.o. female presenting with weakness. The history is provided by the patient. No language interpreter was used.  Weakness This is a new problem. The current episode started more than 2 days ago. The problem occurs constantly. The problem has not changed since onset.Pertinent negatives include no chest pain. Nothing aggravates the symptoms. Nothing relieves the symptoms. She has tried nothing for the symptoms.    HPI Comments: Tamara Mitchell is a 79 y.o. female who presents to the Emergency Department complaining of 3 days of generalized weakness with associated intermittent palpitations described as a rapid heart beat that started yesterday and the development of diffuse myalgias today. She states that the symptoms occur daily and have no modifying factors. She denies changes with ambulation.Dr. Margo Aye recently reduced her degoxin to "half a pill" 2 days ago but is unsure why. She denies dizziness as associated symptoms. Pt's coumadin was stopped 4 weeks ago and she was put on xarelto for her A. Fib.  Past Medical History  Diagnosis Date  . Hypertension   . Atrial fibrillation    Past Surgical History  Procedure Laterality Date  . Cholecystectomy    . Total knee arthroplasty    . Tonsillectomy     No family history on file. History  Substance Use Topics  . Smoking status: Never Smoker   . Smokeless tobacco: Not on file  . Alcohol Use: No   No OB history provided.  Review of Systems  Cardiovascular: Positive for palpitations. Negative for chest pain.  Musculoskeletal: Positive for myalgias.  Negative for back pain.  Neurological: Positive for weakness. Negative for dizziness.  All other systems reviewed and are negative.    Allergies  Review of patient's allergies indicates no known allergies.  Home Medications   Current Outpatient Rx  Name  Route  Sig  Dispense  Refill  . atenolol-chlorthalidone (TENORETIC) 50-25 MG per tablet   Oral   Take 1 tablet by mouth daily.         . digoxin (LANOXIN) 0.125 MG tablet   Oral   Take 0.125 mg by mouth daily.         Marland Kitchen donepezil (ARICEPT) 10 MG tablet   Oral   Take 10 mg by mouth at bedtime.         . DULoxetine (CYMBALTA) 30 MG capsule   Oral   Take 30 mg by mouth daily.         . fluticasone (FLONASE) 50 MCG/ACT nasal spray   Nasal   Place 2 sprays into the nose daily.         . Omega-3 Fatty Acids (FISH OIL) 1000 MG CAPS   Oral   Take 1,000 mg by mouth 2 (two) times daily.         . Rivaroxaban (XARELTO) 20 MG TABS   Oral   Take 20 mg by mouth daily.           Triage Vitals: BP 121/76  Pulse 63  Temp(Src) 97.7 F (36.5 C) (Oral)  Resp 19  SpO2 96%  Physical Exam  Nursing note and vitals reviewed. Constitutional: She is oriented to person, place, and time. She appears well-developed and well-nourished.  HENT:  Head: Normocephalic and atraumatic.  Eyes: Conjunctivae and EOM are normal. Pupils are equal, round, and reactive to light.  Neck: Normal range of motion and phonation normal. Neck supple.  Cardiovascular: Normal rate, regular rhythm and intact distal pulses.   Pulmonary/Chest: Effort normal and breath sounds normal. She exhibits no tenderness.  Abdominal: Soft. She exhibits no distension. There is no tenderness. There is no guarding.  Musculoskeletal: Normal range of motion.  Neurological: She is alert and oriented to person, place, and time. She has normal strength. She exhibits normal muscle tone.  Skin: Skin is warm and dry.  Psychiatric: She has a normal mood and affect. Her  behavior is normal. Judgment and thought content normal.    ED Course  Procedures (including critical care time)  DIAGNOSTIC STUDIES: Oxygen Saturation is 96% on room air, normal by my interpretation.    COORDINATION OF CARE: 4:14 PM-Discussed treatment plan which includes EKG, CXR, CBC panel, BMP and UA with pt at bedside and pt agreed to plan. Will also order a digoxin level and will consult Dr. Margo Aye.  5:14 PM-Consult complete with Dr. Margo Aye, PCP. Patient case explained and discussed. Dr. Margo Aye advises to increase digoxin to prior level. Call ended at 5:17 PM.  5:55 PM-Informed pt of radiology and lab work results. Discussed discharge plan which includes increasing digoxin level with pt and pt agreed to plan. Also advised pt to follow up as needed and pt agreed. Addressed symptoms to return for with pt.    Date: 05/06/2013  Rate: 66  Rhythm: atrial fibrillation  QRS Axis: normal  Intervals: normal  ST/T Wave abnormalities: normal  Conduction Disutrbances:right bundle branch block  Narrative Interpretation:   Old EKG Reviewed: none available  Consult: 17:15; discussed case with Dr. Margo Aye, who recommends restarting her usual dose of digoxin and followup with him in the office. He will have his office staff call her to arrange the appointment   Labs Reviewed  CBC WITH DIFFERENTIAL - Abnormal; Notable for the following:    RBC 3.75 (*)    Hemoglobin 10.2 (*)    HCT 32.1 (*)    Platelets 472 (*)    All other components within normal limits  BASIC METABOLIC PANEL - Abnormal; Notable for the following:    Potassium 3.3 (*)    CO2 33 (*)    GFR calc non Af Amer 87 (*)    All other components within normal limits  DIGOXIN LEVEL - Abnormal; Notable for the following:    Digoxin Level 0.3 (*)    All other components within normal limits  TROPONIN I   Dg Chest 2 View  05/06/2013   *RADIOLOGY REPORT*  Clinical Data: Unable to stand.  Bodyaches tachycardia.  CHEST - 2 VIEW   Comparison: None.  Findings: Multichamber cardiomegaly, with notable enlargement of the left ventricle and left atrium.  No failure.  Hyperinflated lungs compatible with air trapping.  No evidence of pneumonia, effusion, pneumothorax.  No acute osseous abnormality.  IMPRESSION: 1. Multichamber cardiomegaly without failure. 2.  Question COPD.   Original Report Authenticated By: Tiburcio Pea   Radiologic imaging report reviewed and images by radiology - viewed, by me. Nursing Notes Reviewed/ Care Coordinated Applicable Imaging Reviewed Interpretation of Laboratory Data incorporated into ED treatment  1. Palpitation     MDM   Palpitations related to recent  decrease of digoxin. Doubt ACS, PE, or pneumonia. She's improved and stabilized in the emergency department.  Plan: Home Medications- digoxin; Home Treatments- increase digoxin from a half a pill to a whole pill; return here if the recommended treatment, does not improve the symptoms; Recommended follow up- with PCP  I personally performed the services described in this documentation, which was scribed in my presence. The recorded information has been reviewed and is accurate.         Flint Melter, MD 05/07/13 1536

## 2013-05-06 NOTE — ED Notes (Signed)
C/o feeling gen weakness, denies CP or SOB at this time

## 2013-05-06 NOTE — ED Notes (Signed)
Pt c/o feeling tired and achy x 1 week with intermittent tachycardia.

## 2013-06-03 ENCOUNTER — Encounter (INDEPENDENT_AMBULATORY_CARE_PROVIDER_SITE_OTHER): Payer: Self-pay | Admitting: *Deleted

## 2013-06-17 ENCOUNTER — Ambulatory Visit (INDEPENDENT_AMBULATORY_CARE_PROVIDER_SITE_OTHER): Payer: Medicare Other | Admitting: Internal Medicine

## 2013-06-17 ENCOUNTER — Telehealth (INDEPENDENT_AMBULATORY_CARE_PROVIDER_SITE_OTHER): Payer: Self-pay | Admitting: *Deleted

## 2013-06-17 ENCOUNTER — Other Ambulatory Visit (INDEPENDENT_AMBULATORY_CARE_PROVIDER_SITE_OTHER): Payer: Self-pay | Admitting: *Deleted

## 2013-06-17 ENCOUNTER — Encounter (INDEPENDENT_AMBULATORY_CARE_PROVIDER_SITE_OTHER): Payer: Self-pay | Admitting: Internal Medicine

## 2013-06-17 VITALS — BP 92/72 | HR 76 | Ht 64.0 in | Wt 143.8 lb

## 2013-06-17 DIAGNOSIS — Z860100 Personal history of colon polyps, unspecified: Secondary | ICD-10-CM | POA: Insufficient documentation

## 2013-06-17 DIAGNOSIS — D649 Anemia, unspecified: Secondary | ICD-10-CM

## 2013-06-17 DIAGNOSIS — K921 Melena: Secondary | ICD-10-CM

## 2013-06-17 DIAGNOSIS — I10 Essential (primary) hypertension: Secondary | ICD-10-CM | POA: Insufficient documentation

## 2013-06-17 DIAGNOSIS — I4891 Unspecified atrial fibrillation: Secondary | ICD-10-CM | POA: Insufficient documentation

## 2013-06-17 DIAGNOSIS — Z1211 Encounter for screening for malignant neoplasm of colon: Secondary | ICD-10-CM

## 2013-06-17 DIAGNOSIS — R634 Abnormal weight loss: Secondary | ICD-10-CM

## 2013-06-17 MED ORDER — PEG-KCL-NACL-NASULF-NA ASC-C 100 G PO SOLR: 1.0000 | Freq: Once | ORAL | Status: DC

## 2013-06-17 NOTE — Telephone Encounter (Signed)
Patient needs movi prep 

## 2013-06-17 NOTE — Patient Instructions (Addendum)
EGD/Colonoscopy. The risks and benefits such as perforation, bleeding, and infection were reviewed with the patient and is agreeable. 

## 2013-06-17 NOTE — Progress Notes (Signed)
Subjective:     Patient ID: Tamara Mitchell, female   DOB: October 08, 1934, 79 y.o.   MRN: [id number]  HPI Referred to our office 3 positive stool cards.  Recently saw Dr. Margo Aye and this was noted. She tells me some of her stools are black. Her stools have been black off and on for a month. Hx of same in the past. Appetite is good. She has lost weight. She could not taste her food. She was placed on Flonase and appetite improved. No dysphasia. No acid reflux.  She has lost 50 pounds over a year. She has put some of her weight back on.  She has gained 15 pound back. No abdominal pain.  She usually has a BM x 1 a day.  Now she has a BM every 4 days for a month. Stools are hard. Her last colonoscopy was by Dr Linna Darner 10 2009 . Biopsy Tubulovillous adenoma (3) Underwent colonoscopy for rectal bleeding. Her atrial fib was diagnosed during the procedure. She has never undergone an EGD in the past.  Recent EKG in July revealed atrial fib which is not new. Hx of Atrial fib and is maintained on Xarelto.  CBC    Component Value Date/Time   WBC 8.8 05/06/2013 1551   RBC 3.75* 05/06/2013 1551   HGB 10.2* 05/06/2013 1551   HCT 32.1* 05/06/2013 1551   PLT 472* 05/06/2013 1551   MCV 85.6 05/06/2013 1551   MCH 27.2 05/06/2013 1551   MCHC 31.8 05/06/2013 1551   RDW 15.5 05/06/2013 1551   LYMPHSABS 1.6 05/06/2013 1551   MONOABS 0.9 05/06/2013 1551   EOSABS 0.2 05/06/2013 1551   BASOSABS 0.1 05/06/2013 1551   05/03/2013 H and H 9.9 and 30.3, MCV 82.3. HA1C 6.0 Dig Level 0.8 TSH 1.007   Review of Systems Current Outpatient Prescriptions  Medication Sig Dispense Refill  . atenolol-chlorthalidone (TENORETIC) 50-25 MG per tablet Take 1 tablet by mouth daily.      . digoxin (LANOXIN) 0.125 MG tablet Take 0.125 mg by mouth daily.      Tery Sanfilippo Calcium (STOOL SOFTENER PO) Take by mouth.      . donepezil (ARICEPT) 10 MG tablet Take 10 mg by mouth at bedtime.      . DULoxetine (CYMBALTA) 30 MG capsule Take 30 mg by mouth  daily.      . fluticasone (FLONASE) 50 MCG/ACT nasal spray Place 2 sprays into the nose daily.      . Omega-3 Fatty Acids (FISH OIL) 1000 MG CAPS Take 1,000 mg by mouth 2 (two) times daily.      . Rivaroxaban (XARELTO) 20 MG TABS Take 20 mg by mouth daily.       No current facility-administered medications for this visit.   Past Medical History  Diagnosis Date  . Hypertension   . Atrial fibrillation    Past Surgical History  Procedure Laterality Date  . Cholecystectomy    . Total knee arthroplasty      Left knee in 2013  . Tonsillectomy     No Known Allergies     Objective:   Physical Exam  Filed Vitals:   06/17/13 1510  BP: 92/72  Pulse: 76  Height: 5\' 4"  (1.626 m)  Weight: 143 lb 12.8 oz (65.227 kg)  Alert and oriented. Skin warm and dry. Oral mucosa is moist.   . Sclera anicteric, conjunctivae is pink. Thyroid not enlarged. No cervical lymphadenopathy. Lungs clear. Heart regular rate and rhythm.  Abdomen is soft. Bowel  sounds are positive. No hepatomegaly. No abdominal masses felt. No tenderness.  No edema to lower extremities. Stool black and guaiac positive      Assessment:    Melena, anemia. PUD, carcinoma needs to be ruled out. She also has had a recent hx of weight loss. Constipation which is new.  Hx of tubulovillous adenoma in 2009. Colonic neoplasm needs to be ruled out as well as polyps.     Plan:    Stool softer once a day.  EGD/colonoscopy. The risks and benefits such as perforation, bleeding, and infection were reviewed with the patient and is agreeable. Ferritin, Iron, % Sat.

## 2013-06-18 LAB — IRON AND TIBC
%SAT: 4 % — ABNORMAL LOW (ref 20–55)
Iron: 16 ug/dL — ABNORMAL LOW (ref 42–145)
UIBC: 383 ug/dL (ref 125–400)

## 2013-06-21 ENCOUNTER — Encounter (HOSPITAL_COMMUNITY): Payer: Self-pay | Admitting: Pharmacy Technician

## 2013-06-22 ENCOUNTER — Ambulatory Visit (HOSPITAL_COMMUNITY)
Admission: RE | Admit: 2013-06-22 | Discharge: 2013-06-22 | Disposition: A | Discharge status: Home / Self Care | Payer: Medicare Other | Source: Ambulatory Visit | Attending: Internal Medicine | Admitting: Internal Medicine

## 2013-06-22 ENCOUNTER — Encounter (HOSPITAL_COMMUNITY): Payer: Self-pay | Admitting: *Deleted

## 2013-06-22 ENCOUNTER — Encounter (HOSPITAL_COMMUNITY)
Admission: RE | Disposition: A | Discharge status: Home / Self Care | Payer: Self-pay | Source: Ambulatory Visit | Attending: Internal Medicine

## 2013-06-22 DIAGNOSIS — Z01812 Encounter for preprocedural laboratory examination: Secondary | ICD-10-CM | POA: Insufficient documentation

## 2013-06-22 DIAGNOSIS — R634 Abnormal weight loss: Secondary | ICD-10-CM

## 2013-06-22 DIAGNOSIS — K2970 Gastritis, unspecified, without bleeding: Secondary | ICD-10-CM | POA: Insufficient documentation

## 2013-06-22 DIAGNOSIS — D649 Anemia, unspecified: Secondary | ICD-10-CM

## 2013-06-22 DIAGNOSIS — K2960 Other gastritis without bleeding: Secondary | ICD-10-CM

## 2013-06-22 DIAGNOSIS — D509 Iron deficiency anemia, unspecified: Secondary | ICD-10-CM | POA: Insufficient documentation

## 2013-06-22 DIAGNOSIS — K449 Diaphragmatic hernia without obstruction or gangrene: Secondary | ICD-10-CM

## 2013-06-22 DIAGNOSIS — K208 Other esophagitis: Secondary | ICD-10-CM

## 2013-06-22 DIAGNOSIS — D128 Benign neoplasm of rectum: Secondary | ICD-10-CM

## 2013-06-22 DIAGNOSIS — Z860100 Personal history of colon polyps, unspecified: Secondary | ICD-10-CM | POA: Insufficient documentation

## 2013-06-22 DIAGNOSIS — K644 Residual hemorrhoidal skin tags: Secondary | ICD-10-CM | POA: Insufficient documentation

## 2013-06-22 DIAGNOSIS — I10 Essential (primary) hypertension: Secondary | ICD-10-CM | POA: Insufficient documentation

## 2013-06-22 DIAGNOSIS — K279 Peptic ulcer, site unspecified, unspecified as acute or chronic, without hemorrhage or perforation: Secondary | ICD-10-CM

## 2013-06-22 DIAGNOSIS — D129 Benign neoplasm of anus and anal canal: Secondary | ICD-10-CM

## 2013-06-22 DIAGNOSIS — K2100 Gastro-esophageal reflux disease with esophagitis, without bleeding: Secondary | ICD-10-CM | POA: Insufficient documentation

## 2013-06-22 DIAGNOSIS — Q438 Other specified congenital malformations of intestine: Secondary | ICD-10-CM

## 2013-06-22 DIAGNOSIS — Z79899 Other long term (current) drug therapy: Secondary | ICD-10-CM | POA: Insufficient documentation

## 2013-06-22 DIAGNOSIS — K921 Melena: Secondary | ICD-10-CM | POA: Insufficient documentation

## 2013-06-22 HISTORY — PX: COLONOSCOPY WITH ESOPHAGOGASTRODUODENOSCOPY (EGD): SHX5779

## 2013-06-22 SURGERY — COLONOSCOPY WITH ESOPHAGOGASTRODUODENOSCOPY (EGD)
Anesthesia: Moderate Sedation

## 2013-06-22 MED ORDER — MEPERIDINE HCL 50 MG/ML IJ SOLN
INTRAMUSCULAR | Status: DC | PRN
  Administered 2013-06-22: 15 mg via INTRAVENOUS
  Administered 2013-06-22: 20 mg via INTRAVENOUS
  Administered 2013-06-22: 15 mg via INTRAVENOUS

## 2013-06-22 MED ORDER — PANTOPRAZOLE SODIUM 40 MG PO TBEC: 40.0000 mg | DELAYED_RELEASE_TABLET | Freq: Every day | ORAL | Status: DC

## 2013-06-22 MED ORDER — SODIUM CHLORIDE 0.9 % IV SOLN
INTRAVENOUS | Status: DC
  Administered 2013-06-22: 07:00:00 via INTRAVENOUS

## 2013-06-22 MED ORDER — STERILE WATER FOR IRRIGATION IR SOLN
Status: DC | PRN
  Administered 2013-06-22: 08:00:00

## 2013-06-22 MED ORDER — MEPERIDINE HCL 50 MG/ML IJ SOLN
INTRAMUSCULAR | Status: AC
  Filled 2013-06-22: qty 1

## 2013-06-22 MED ORDER — MIDAZOLAM HCL 5 MG/5ML IJ SOLN
INTRAMUSCULAR | Status: AC
  Filled 2013-06-22: qty 10

## 2013-06-22 MED ORDER — MIDAZOLAM HCL 5 MG/5ML IJ SOLN
INTRAMUSCULAR | Status: DC | PRN
  Administered 2013-06-22 (×3): 1 mg via INTRAVENOUS
  Administered 2013-06-22: 2 mg via INTRAVENOUS
  Administered 2013-06-22 (×2): 1 mg via INTRAVENOUS

## 2013-06-22 MED ORDER — BUTAMBEN-TETRACAINE-BENZOCAINE 2-2-14 % EX AERO
INHALATION_SPRAY | CUTANEOUS | Status: DC | PRN
  Administered 2013-06-22: 2 via TOPICAL

## 2013-06-22 NOTE — H&P (Signed)
Tamara Mitchell is an 79 y.o. female.   Chief Complaint: Patient is here for EGD and colonoscopy. HPI: She is 79 year old Caucasian female who presents with one-month history of intermittent hematochezia. She was evaluated by Dr. Dwana Melena and noted to have 3 out of 3 heme-positive stools. She also has anemia and iron studies consistent with iron deficiency. Patient also has history of colonic adenomas. She is to be removed for 2009. She has atrial fibrillation and was switched from warfarin to [place] about 4 or 5 months ago. She denies heartburn nausea vomiting dysphasia abdominal pain anorexia or weight loss. Family history is negative for [facility]  Past Medical History  Diagnosis Date  . Hypertension   . Atrial fibrillation     Past Surgical History  Procedure Laterality Date  . Cholecystectomy    . Total knee arthroplasty      Left knee in 2013  . Tonsillectomy      History reviewed. No pertinent family history. Social History:  reports that she has never smoked. She does not have any smokeless tobacco history on file. She reports that she does not drink alcohol or use illicit drugs.  Allergies: No Known Allergies  Medications Prior to Admission  Medication Sig Dispense Refill  . atenolol-chlorthalidone (TENORETIC) 50-25 MG per tablet Take 1 tablet by mouth daily.      . digoxin (LANOXIN) 0.125 MG tablet Take 0.125 mg by mouth daily.      Marland Kitchen donepezil (ARICEPT) 10 MG tablet Take 10 mg by mouth at bedtime.      . DULoxetine (CYMBALTA) 30 MG capsule Take 30 mg by mouth daily.      . fluticasone (FLONASE) 50 MCG/ACT nasal spray Place 2 sprays into the nose daily.      . Omega-3 Fatty Acids (FISH OIL) 1000 MG CAPS Take 1,000 mg by mouth 2 (two) times daily.      . Rivaroxaban (XARELTO) 20 MG TABS Take 20 mg by mouth daily.        No results found for this or any previous visit (from the past 48 hour(s)). No results found.  ROS  Blood pressure 129/71, temperature 98.1 F (36.7 C),  temperature source Oral, resp. rate 20, height 5\' 4"  (1.626 m), weight 143 lb (64.864 kg), SpO2 92.00%. Physical Exam  Constitutional: She appears well-developed and well-nourished.  HENT:  Mouth/Throat: Oropharynx is clear and moist.  Eyes: Conjunctivae are normal. No scleral icterus.  Neck: No thyromegaly present.  Cardiovascular:  Irregular rhythm, normal S1 and S2. Grade 3/6 systolic ejection murmur best heard at LLSB.  Respiratory: Effort normal and breath sounds normal.  GI: Soft. She exhibits no distension and no mass. There is no tenderness.  Musculoskeletal: She exhibits no edema.  Lymphadenopathy:    She has no cervical adenopathy.  Neurological: She is alert.  Skin: Skin is warm and dry.     Assessment/Plan Melena and positive stools. Iron deficiency anemia. History of colonic adenomas. EGD and colonoscopy.  Crosby Bevan U 06/22/2013, 7:35 AM

## 2013-06-22 NOTE — Op Note (Signed)
EGD PROCEDURE REPORT  PATIENT:  Tamara Mitchell  MR#:  [id number] Birthdate:  [birth year], 79 y.o., female Endoscopist:  Dr. Malissa Hippo, MD Referred By:  Dr. Dwana Melena, MD Procedure Date: 06/22/2013  Procedure:   EGD & Colonoscopy  Indications:  Patient is 79 year old Caucasian female who presents with history of melena. Has heme positive stools and iron deficiency anemia. She also has history of colonic adenomas her last colonoscopy was in 2009 at [facility] in [place], [place] (Dr. Linna Darner). Patient has chronic atrial fibrillation and she was switched from warfarin to Xarelto tfew 4-5 months ago. Patient has no GI symptoms. Family history is negative for CRC.            Informed Consent:  The risks, benefits, alternatives & imponderables which include, but are not limited to, bleeding, infection, perforation, drug reaction and potential missed lesion have been reviewed.  The potential for biopsy, lesion removal, esophageal dilation, etc. have also been discussed.  Questions have been answered.  All parties agreeable.  Please see history & physical in medical record for more information.  Medications:  Demerol 50 mg IV Versed 7 mg IV Cetacaine spray topically for oropharyngeal anesthesia  EGD  Description of procedure:  The endoscope was introduced through the mouth and advanced to the second portion of the duodenum without difficulty or limitations. The mucosal surfaces were surveyed very carefully during advancement of the scope and upon withdrawal.  Findings:  Esophagus:  Mucosa of the esophagus was normal. Focal edema and erythema noted at GE junction. GEJ:  39 cm Hiatus:  41 cm Stomach:  Stomach was empty and distended very well with insufflation. Folds in the proximal stomach were normal. Examination mucosa and gastric body was normal. Focal mucosal edema and erythema noted at antrum in the vicinity of scar. Angularis fundus and cardia was unremarkable. Spastic pyloric channel with  5 mm  ulcer towards anterior wall. Duodenum:  Normal bulbar and post bulbar mucosa.  Therapeutic/Diagnostic Maneuvers Performed:  None  COLONOSCOPY Description of procedure:  After a digital rectal exam was performed, that colonoscope was advanced from the anus through the rectum and colon to the area of hepatic flexure. Could not advance the scope to cecum as she tended to form 2 different loops. As the scope was slowly and cautiously withdrawn mucosal surfaces were carefully surveyed utilizing scope tip to flexion to facilitate fold flattening as needed. The scope was pulled down into the rectum where a thorough exam including retroflexion was performed.  Findings:   Proper excellent. Redundant colon resulting in incomplete exam to hepatic flexure. Over 4 cm complex polyp noted at distal rectum. Was partly polypoidal and partly sessile. Distal margin of this polyp was very close to dentate line. The spleen polypectomy performed. Bulk of the polyp was removed with polypectomy felt to be incomplete. Polypectomy site treated with argon plasma coagulator. Small hemorrhoids below the dentate line.  Therapeutic/Diagnostic Maneuvers Performed:  See above.  Complications:  None  Cecal Withdrawal Time:  NA  Impression:  Mild changes of reflux esophagitis limited to GE junction. Small sliding hiatal hernia. Large antral scar with focal gastritis. 5 mm pyloric channel ulcer. Incomplete colonoscopy to hepatic flexure. Large complex polyp at distal rectum. Piecemeal polypectomy performed. Polypectomy site treated with argon plasma coagulator. Polypectomy incomplete.  Recommendations:  Continue to hold Xarelto for now. H. pylori serology and H&H. Pantoprazole 40 mg by mouth twice a day. I will be contacting patient with biopsy results and further recommendations later  this week.   [name]  06/22/2013 9:19 AM  CC: Dr. Catalina Pizza, MD & Dr. Bonnetta Barry ref. provider found

## 2013-06-29 ENCOUNTER — Encounter (INDEPENDENT_AMBULATORY_CARE_PROVIDER_SITE_OTHER): Payer: Self-pay | Admitting: *Deleted

## 2013-06-30 ENCOUNTER — Emergency Department (HOSPITAL_COMMUNITY)
Admission: EM | Admit: 2013-06-30 | Discharge: 2013-06-30 | Disposition: A | Discharge status: Home / Self Care | Payer: Medicare Other | Source: Home / Self Care

## 2013-06-30 ENCOUNTER — Encounter (HOSPITAL_COMMUNITY): Payer: Self-pay | Admitting: Internal Medicine

## 2013-06-30 DIAGNOSIS — S0101XA Laceration without foreign body of scalp, initial encounter: Secondary | ICD-10-CM

## 2013-06-30 DIAGNOSIS — S0100XA Unspecified open wound of scalp, initial encounter: Secondary | ICD-10-CM

## 2013-06-30 MED ORDER — TETANUS-DIPHTH-ACELL PERTUSSIS 5-2.5-18.5 LF-MCG/0.5 IM SUSP
0.5000 mL | Freq: Once | INTRAMUSCULAR | Status: AC
  Administered 2013-06-30: 0.5 mL via INTRAMUSCULAR

## 2013-06-30 MED ORDER — TETANUS-DIPHTH-ACELL PERTUSSIS 5-2.5-18.5 LF-MCG/0.5 IM SUSP
INTRAMUSCULAR | Status: AC
  Filled 2013-06-30: qty 0.5

## 2013-06-30 NOTE — ED Notes (Signed)
Larey Seat and hit her head on a chair @ 1600. No LOC.  No pain or headache.  Superficial laceration to top of head.  Bleeding stopped.

## 2013-06-30 NOTE — ED Provider Notes (Signed)
CSN: [id number]     Arrival date & time 06/30/13  1757 History   None    No chief complaint on file.  (Consider location/radiation/quality/duration/timing/severity/associated sxs/prior Treatment) Patient is a 79 y.o. female presenting with scalp laceration. The history is provided by the patient and a friend.  Head Laceration This is a new problem. The current episode started 3 to 5 hours ago (tripped over chair getting up from card game tonight, struck head sustaining scalp lac.). The problem has not changed since onset.Pertinent negatives include no headaches.    Past Medical History  Diagnosis Date  . Hypertension   . Atrial fibrillation    Past Surgical History  Procedure Laterality Date  . Cholecystectomy    . Total knee arthroplasty      Left knee in 2013  . Tonsillectomy    . Colonoscopy with esophagogastroduodenoscopy (egd) N/A 06/22/2013    Procedure: COLONOSCOPY WITH ESOPHAGOGASTRODUODENOSCOPY (EGD);  Surgeon: Malissa Hippo, MD;  [facility]: [facility];  Service: Endoscopy;  Laterality: N/A;  730   No family history on file. History  Substance Use Topics  . Smoking status: Never Smoker   . Smokeless tobacco: Not on file  . Alcohol Use: No   OB History   Grav Para Term Preterm Abortions TAB SAB Ect Mult Living                 Review of Systems  Constitutional: Negative.   Skin: Negative.   Neurological: Negative.  Negative for headaches.    Allergies  Review of patient's allergies indicates no known allergies.  Home Medications   Current Outpatient Rx  Name  Route  Sig  Dispense  Refill  . atenolol-chlorthalidone (TENORETIC) 50-25 MG per tablet   Oral   Take 1 tablet by mouth daily.         . digoxin (LANOXIN) 0.125 MG tablet   Oral   Take 0.125 mg by mouth daily.         Marland Kitchen donepezil (ARICEPT) 10 MG tablet   Oral   Take 10 mg by mouth at bedtime.         . DULoxetine (CYMBALTA) 30 MG capsule   Oral   Take 30 mg by mouth daily.          . fluticasone (FLONASE) 50 MCG/ACT nasal spray   Nasal   Place 2 sprays into the nose daily.         . Omega-3 Fatty Acids (FISH OIL) 1000 MG CAPS   Oral   Take 1,000 mg by mouth 2 (two) times daily.         . pantoprazole (PROTONIX) 40 MG tablet   Oral   Take 1 tablet (40 mg total) by mouth daily.   60 tablet   5    BP 134/67  Pulse 83  Temp(Src) 98.3 F (36.8 C) (Oral)  Resp 16  SpO2 97% Physical Exam  Nursing note and vitals reviewed. Constitutional: She is oriented to person, place, and time. She appears well-developed and well-nourished. No distress.  HENT:  Head: Normocephalic.  Right Ear: External ear normal.  Left Ear: External ear normal.  Mouth/Throat: Oropharynx is clear and moist.  1cm vertical midline occip lac , no bleeding , no sutures needed.  Eyes: Conjunctivae are normal. Pupils are equal, round, and reactive to light.  Neck: Normal range of motion. Neck supple.  Neurological: She is alert and oriented to person, place, and time.  Skin: Skin is warm and  dry.    ED Course  Procedures (including critical care time) Labs Review Labs Reviewed - No data to display Imaging Review No results found.  MDM   1. Occipital scalp laceration, initial encounter    Minor wound care to scalp lac.   Linna Hoff, MD 06/30/13 [phone]

## 2013-07-13 ENCOUNTER — Ambulatory Visit (INDEPENDENT_AMBULATORY_CARE_PROVIDER_SITE_OTHER): Payer: Medicare Other | Admitting: Internal Medicine

## 2013-07-23 ENCOUNTER — Encounter: Payer: Self-pay | Admitting: *Deleted

## 2013-07-29 ENCOUNTER — Encounter: Payer: Self-pay | Admitting: Internal Medicine

## 2013-07-29 ENCOUNTER — Ambulatory Visit (INDEPENDENT_AMBULATORY_CARE_PROVIDER_SITE_OTHER): Payer: Medicare Other | Admitting: Internal Medicine

## 2013-07-29 VITALS — BP 142/80 | HR 66 | Ht 64.0 in | Wt 144.4 lb

## 2013-07-29 DIAGNOSIS — I10 Essential (primary) hypertension: Secondary | ICD-10-CM

## 2013-07-29 DIAGNOSIS — I83009 Varicose veins of unspecified lower extremity with ulcer of unspecified site: Secondary | ICD-10-CM

## 2013-07-29 DIAGNOSIS — I83029 Varicose veins of left lower extremity with ulcer of unspecified site: Secondary | ICD-10-CM

## 2013-07-29 DIAGNOSIS — I4891 Unspecified atrial fibrillation: Secondary | ICD-10-CM

## 2013-07-29 DIAGNOSIS — I83893 Varicose veins of bilateral lower extremities with other complications: Secondary | ICD-10-CM

## 2013-07-29 DIAGNOSIS — D62 Acute posthemorrhagic anemia: Secondary | ICD-10-CM

## 2013-07-29 NOTE — Patient Instructions (Addendum)
Your physician has requested that you have a lower extremity venous duplex. This test is an ultrasound of the veins in the legs. It looks at venous blood flow that carries blood from the heart to the legs. Allow one hour for a Lower Venous exam. There are no restrictions or special instructions.  Dr. Rennis Golden has referred you to the wound care center at [facility].  Your physician wants you to follow-up in: 6 months. You will receive a reminder letter in the mail two months in advance. If you don't receive a letter, please call our office to schedule the follow-up appointment.

## 2013-07-29 NOTE — Progress Notes (Signed)
OFFICE NOTE  Chief Complaint:  Routine followup  Primary Care Physician: Catalina Pizza, MD  HPI:  Tamara Mitchell is a 79 year old female with a history of chronic atrial fibrillation which is permanent and on Coumadin and digoxin, also a history of recent knee surgery which she tolerated well. She has been dizzy from time to time, and recently had a fall with scalp laceration. She did not have a CT scan to evaluate for any intracranial bleeding. She's also had recent anemia and was thought to be due to GI blood loss. She had an endoscopy which showed a bleeding ulcer that was treated. she is currently on iron. She has hypertension which has been well controlled with mild dementia. Finally, she reports similar strandy swelling and recently has noted a sore that developed on her left lateral shin. This she says was related to her GI endoscopy procedure and developed after that time. It has not healed despite applications of topical antibiotic ointment. She denies any fevers, chills or sweats. Chest denies any chest pain or worsening shortness of breath.  PMHx:  Past Medical History  Diagnosis Date  . Hypertension   . Atrial fibrillation     digoxin, coumadin  . Mild dementia   . Mitral insufficiency   . Tricuspid insufficiency   . Moderate to severe pulmonary hypertension     moderate  . Chronic venous insufficiency   . History of nuclear stress test 12/12/05/2010    lexiscan; non-diagnostic for ischemia, low risk     Past Surgical History  Procedure Laterality Date  . Cholecystectomy  2006  . Total knee arthroplasty Left 2013  . Tonsillectomy    . Colonoscopy with esophagogastroduodenoscopy (egd) N/A 06/22/2013    Procedure: COLONOSCOPY WITH ESOPHAGOGASTRODUODENOSCOPY (EGD);  Surgeon: Malissa Hippo, MD;  Location: [facility];  Service: Endoscopy;  Laterality: N/A;  730  . Transthoracic echocardiogram  10/24/2012    EF=50-55%, mod conc LVH; RV mod-severely dilated & systolic  function mildly reduced; LA severely dialted, possible small PFO; mild mitral annular calcif & mild MR; mod TR & elevated RV systolic pressure, mod pulm HTN; AV mildyl sclerotic & mild-mod regurg (performed for murmur & afib)    FAMHx:  History reviewed. No pertinent family history.  SOCHx:   reports that she has never smoked. She has never used smokeless tobacco. She reports that she does not drink alcohol or use illicit drugs.  ALLERGIES:  No Known Allergies  ROS: A comprehensive review of systems was negative except for: Constitutional: positive for fatigue Cardiovascular: positive for irregular heart beat Integument/breast: positive for skin lesion(s)  HOME MEDS: Current Outpatient Prescriptions  Medication Sig Dispense Refill  . atenolol-chlorthalidone (TENORETIC) 50-25 MG per tablet Take 1 tablet by mouth daily.      . digoxin (LANOXIN) 0.125 MG tablet Take 0.125 mg by mouth daily.      Marland Kitchen donepezil (ARICEPT) 10 MG tablet Take 10 mg by mouth at bedtime.      . DULoxetine (CYMBALTA) 30 MG capsule Take 30 mg by mouth daily.      . fluticasone (FLONASE) 50 MCG/ACT nasal spray Place 2 sprays into the nose daily.      . Omega-3 Fatty Acids (FISH OIL) 1000 MG CAPS Take 1,000 mg by mouth 2 (two) times daily.      . pantoprazole (PROTONIX) 40 MG tablet Take 1 tablet (40 mg total) by mouth daily.  60 tablet  5  . Rivaroxaban (XARELTO) 20 MG TABS  tablet Take 20 mg by mouth daily.       No current facility-administered medications for this visit.    LABS/IMAGING: No results found for this or any previous visit (from the past 48 hour(s)). No results found.  VITALS: BP 142/80  Pulse 66  Ht 5\' 4"  (1.626 m)  Wt 144 lb 6.4 oz (65.499 kg)  BMI 24.77 kg/m2  EXAM: General appearance: alert and no distress Neck: no adenopathy, no carotid bruit, no JVD, supple, symmetrical, trachea midline and thyroid not enlarged, symmetric, no tenderness/mass/nodules Lungs: clear to auscultation  bilaterally Heart: regular rate and rhythm, S1, S2 normal, no murmur, click, rub or gallop Abdomen: soft, non-tender; bowel sounds normal; no masses,  no organomegaly Extremities: extremities normal, atraumatic, no cyanosis or edema, varicose veins noted, venous stasis dermatitis noted and there appears to be a 1-2 cm ulcer on the left anterior shin, which is poorly healing Pulses: 2+ and symmetric Skin: CEAP 1,2,3,4a,6 (left) Neurologic: Mental status: Alert, oriented, thought content appropriate Psych:  Mood and affect normal  EKG: Atrial fibrillation at 66  ASSESSMENT: 1. Chronic atrial fibrillation on Xarelto 2. Bilateral venous insufficiency with nonhealing left ulcer 3. Hypertension-fairly well-controlled  PLAN: 1.   [name] has bilateral venous insufficiency and signs of a nonhealing wound of the left lower leg. I will refer her to the wound care center at [facility] as this lesion is high risk for infection. In addition will obtain bilateral venous insufficiency studies to see if she has venous reflux that is potentially treatable. She continues to do fairly well on the Xarelto although has some anemia possibly from acute blood loss. She apparently had a gastric ulcer which was treated. Although she has had some instability and falls, I still feel that she has a greater benefit from Xarelto with stroke risk reduction in the risk. She is contemplating right knee replacement surgery, which may help somewhat with her stability. Plan to contact her with results of her venous insufficiency study and we'll see her back in 3-6 months.  Chrystie Nose, MD, [facility] Attending Cardiologist [facility]  HILTY,Kenneth C 07/29/2013, 2:59 PM

## 2013-08-02 ENCOUNTER — Telehealth: Payer: Self-pay | Admitting: Internal Medicine

## 2013-08-02 ENCOUNTER — Encounter: Payer: Self-pay | Admitting: Internal Medicine

## 2013-08-02 ENCOUNTER — Telehealth (INDEPENDENT_AMBULATORY_CARE_PROVIDER_SITE_OTHER): Payer: Self-pay | Admitting: *Deleted

## 2013-08-02 ENCOUNTER — Other Ambulatory Visit (INDEPENDENT_AMBULATORY_CARE_PROVIDER_SITE_OTHER): Payer: Self-pay | Admitting: Internal Medicine

## 2013-08-02 DIAGNOSIS — L989 Disorder of the skin and subcutaneous tissue, unspecified: Secondary | ICD-10-CM

## 2013-08-02 NOTE — Telephone Encounter (Signed)
C/o significant leg pain she thinks is caused by the venous insufficiency.  States she is unable to get around very well because of the pain.  Will defer to Dr. Rennis Golden .

## 2013-08-02 NOTE — Telephone Encounter (Signed)
Wanted to let Dr. Karilyn Cota know her leg has not healed from when she had the procedure and got hurt. Tamara Mitchell thinks she may need to [facility] and would like to see if Dr. Karilyn Cota would send an order there for her. The return phone number is [phone].

## 2013-08-02 NOTE — Telephone Encounter (Signed)
Forwarded to [facility] to address.

## 2013-08-02 NOTE — Telephone Encounter (Signed)
Talked with patient. She has skin peeling off one of her legs at the time of colonoscopy when she was turned. Against this area is not healing because she may have edema and venous stasis. I wish she had called earlier and not need this long. Will refer her to wound clinic. Please make an appointment and let patient know.

## 2013-08-02 NOTE — Telephone Encounter (Signed)
appt w/ [facility] 08/05/13 at 100, patient aware

## 2013-08-02 NOTE — Telephone Encounter (Signed)
Please call-needs something for pain in her legs.

## 2013-08-03 ENCOUNTER — Encounter (INDEPENDENT_AMBULATORY_CARE_PROVIDER_SITE_OTHER): Payer: Self-pay

## 2013-08-03 NOTE — Telephone Encounter (Signed)
Thanks .Marland Kitchen She will have to see PCP for pain medications. There are orthopedic issues and other causes for pain as well.   -Dr. Rennis Golden

## 2013-08-05 NOTE — Telephone Encounter (Signed)
LM for patient to get pain meds from PCP.

## 2013-08-06 ENCOUNTER — Telehealth: Payer: Self-pay | Admitting: Internal Medicine

## 2013-08-06 NOTE — Telephone Encounter (Signed)
Patient had another question for Dr. Rennis Golden.  Does she still need to have her leg ultrasound  On Monday?

## 2013-08-06 NOTE — Telephone Encounter (Signed)
LM to have venous doppler done - Dr. Rennis Golden ordered to R/O venous insuff. Told to call if she has additional questions.

## 2013-08-09 ENCOUNTER — Ambulatory Visit (HOSPITAL_COMMUNITY)
Admission: RE | Admit: 2013-08-09 | Discharge: 2013-08-09 | Disposition: A | Discharge status: Home / Self Care | Payer: Medicare Other | Source: Ambulatory Visit | Attending: Cardiovascular Disease | Admitting: Cardiovascular Disease

## 2013-08-09 DIAGNOSIS — S81009A Unspecified open wound, unspecified knee, initial encounter: Secondary | ICD-10-CM | POA: Insufficient documentation

## 2013-08-09 DIAGNOSIS — M7989 Other specified soft tissue disorders: Secondary | ICD-10-CM

## 2013-08-09 DIAGNOSIS — X58XXXA Exposure to other specified factors, initial encounter: Secondary | ICD-10-CM | POA: Insufficient documentation

## 2013-08-09 DIAGNOSIS — I83893 Varicose veins of bilateral lower extremities with other complications: Secondary | ICD-10-CM

## 2013-08-09 NOTE — Progress Notes (Signed)
Venous Duplex Lower Ext. Completed. Tamara Mitchell, BS, RDMS, RVT  

## 2013-08-16 ENCOUNTER — Telehealth: Payer: Self-pay | Admitting: Internal Medicine

## 2013-08-16 NOTE — Telephone Encounter (Signed)
Need to get results of test from last monday

## 2013-08-17 NOTE — Telephone Encounter (Signed)
Returned call and spoke w/ pt.  Informed results have not been received for MD to review and nurse will call after they are reviewed.  Pt verbalized understanding and agreed w/ plan.

## 2013-08-27 ENCOUNTER — Telehealth: Payer: Self-pay | Admitting: Internal Medicine

## 2013-08-27 NOTE — Telephone Encounter (Signed)
Called patient with venous reflux results - patient verbalized understanding.

## 2013-08-27 NOTE — Telephone Encounter (Signed)
Attempted to call; line busy

## 2013-08-27 NOTE — Telephone Encounter (Signed)
Would like doppler results from 10-2-please.

## 2013-09-02 ENCOUNTER — Other Ambulatory Visit: Payer: Self-pay

## 2013-10-05 ENCOUNTER — Encounter (INDEPENDENT_AMBULATORY_CARE_PROVIDER_SITE_OTHER): Payer: Self-pay | Admitting: *Deleted

## 2013-11-03 ENCOUNTER — Telehealth: Payer: Self-pay | Admitting: *Deleted

## 2013-11-03 NOTE — Telephone Encounter (Signed)
Faxed to [facility] Attn: Mickel Baas cardiac clearance (low to intermediate risk, with no further testing needed) for right knee replacement.

## 2013-12-07 HISTORY — PX: TOTAL KNEE ARTHROPLASTY: SHX125

## 2013-12-10 ENCOUNTER — Inpatient Hospital Stay
Admission: RE | Admit: 2013-12-10 | Discharge: 2014-01-06 | Disposition: A | Discharge status: Home / Self Care | Payer: Medicare Other | Source: Ambulatory Visit | Attending: Internal Medicine | Admitting: Internal Medicine

## 2013-12-10 DIAGNOSIS — R609 Edema, unspecified: Principal | ICD-10-CM

## 2013-12-10 DIAGNOSIS — J189 Pneumonia, unspecified organism: Secondary | ICD-10-CM

## 2013-12-10 DIAGNOSIS — R509 Fever, unspecified: Secondary | ICD-10-CM

## 2013-12-10 DIAGNOSIS — R9389 Abnormal findings on diagnostic imaging of other specified body structures: Secondary | ICD-10-CM

## 2013-12-12 ENCOUNTER — Non-Acute Institutional Stay (SKILLED_NURSING_FACILITY): Payer: Medicare Other | Admitting: Internal Medicine

## 2013-12-12 ENCOUNTER — Encounter: Payer: Self-pay | Admitting: Internal Medicine

## 2013-12-12 DIAGNOSIS — F3289 Other specified depressive episodes: Secondary | ICD-10-CM

## 2013-12-12 DIAGNOSIS — F329 Major depressive disorder, single episode, unspecified: Secondary | ICD-10-CM | POA: Insufficient documentation

## 2013-12-12 DIAGNOSIS — R21 Rash and other nonspecific skin eruption: Secondary | ICD-10-CM

## 2013-12-12 DIAGNOSIS — Z96659 Presence of unspecified artificial knee joint: Secondary | ICD-10-CM

## 2013-12-12 DIAGNOSIS — F0390 Unspecified dementia without behavioral disturbance: Secondary | ICD-10-CM

## 2013-12-12 DIAGNOSIS — F32A Depression, unspecified: Secondary | ICD-10-CM

## 2013-12-12 DIAGNOSIS — K259 Gastric ulcer, unspecified as acute or chronic, without hemorrhage or perforation: Secondary | ICD-10-CM

## 2013-12-12 DIAGNOSIS — I4891 Unspecified atrial fibrillation: Secondary | ICD-10-CM

## 2013-12-12 NOTE — Progress Notes (Signed)
Patient ID: Tamara Mitchell, female   DOB: 05-29-1934, 80 y.o.   MRN: [id number]   This is an acute visit.  Level of care skilled.  Facility [facility].  Chief complaint-acute visit status post hospitalization for right knee replacement.  History of present illness.  Patient is a pleasant 80 year old female who was recently admitted to for psych hospital for an elective right knee replacement secondary to advanced osteoarthritis which failed conservative therapy.  The surgery was uncomplicated.  Only postop complication apparently with some drooping of her eyelid on February 11 [place]ass there was concern about a possible CVA-CT without contrast however was negative and the drooping resolve spontaneously.  She has been discharged here for rehabilitation.  She has a diagnosis of atrial fibrillation as well she continues on Xeralto for anticoagulation she also is on atenolol chlorothalidone suspect for rate control as well as hypertension-also on digoxin.  Her vital signs remained stable she has no acute complaints this afternoon nursing staff did not report any concerns either.  Previous medical history.  Right knee osteoarthritis status post replacement.  Atrial fibrillation.  Hypertension.  Dementia.  History of gastric ulcer.    Surgical history.  In addition to recent right total knee replacement as she has a previous history of tonsillectomy-as well as cholecystectomy.  Medications.  Atenolol best chlorthalidone 50-25 mg daily.  Digoxin 125 mcg daily.  Aricept 10 mg daily.  Xeralto 10 mg QD     Cymbalta 30 mg daily.  Flonase 2 sprays each nostril daily.  Protonix 40 mg daily.  Kenalog cream topically when necessary  Hospital studies.  CT scan during recent hospitalization was negative for any acute process  Social history-patient with known history of tobacco or illicit drug use or alcohol use.  She is widowed since 2006 and is retired.  Has used a cane and  walker for ambulation previously.  Family history-on her mother's side there is a history of depression and dementia.  On father's side history of hypertension and liver failure.  Review of systems.  In general denies any fever chills.  Skin-she does have a rash in the vaginal area the family complaining of any discomfort or itching with this.  Head ears eyes nose mouth and throat-does not complaining of visual changes hearing difficulties any nasal discharge.  Respiratory no complaint shortness of breath or cough.  Cardiac no chest pain she does have some edema of her right leg but does not complaining of any pain or discomfort with this.  GI-no complaint of nausea vomiting diarrhea constipation or abdominal pain appears to have a good appetite.  GU does complain of some urinary frequency and again does have a rash in the vaginal area.  Muscle skeletal does not really complaining of any significant pain she does receive Tylenol as needed when necessary  Neurologic no complaints of headache dizziness or syncopal-type feelings.  Psych she is on on Aricept however her dementia appears to be quite mild also on Cymbalta I suspect for some depression which also appears well controlled.  Physical exam.  .  Temperature is 98.5 pulse 81 respirations 20 blood pressure 106/67-113/78-in this will range --weight appears to be stable so far at 146.  In general this is a pleasant elderly female in no distress resting comfortably in bed.  Her skin is warm and dry-she does have an erythematous rash surrounding her vaginal area I do not see any discharge here however.  Eyes-pupils appear equal round reactive to light sclera and conjunctiva  are clear visual acuity appears grossly intact.  Oropharynx clear mucous membranes moist.  Her chest is clear to auscultation no labored breathing.  Heart is irregular irregular rate and rhythm she does have one-2+ edema of her rightt lower leg extending  somewhat up to the thigh- pedal edema is minimal pedal pulse is brisk and intact she does not really have significant edema of her left leg and pedal pulse is intact.  GI-abdomen is soft nontender positive bowel sounds she does have some diffuse bruising I suspect this was due to the Lovenox injections in the hospital.  GU-as stated above she does have some erythema around her vaginal area there is no discharge or significant tenderness.  Muscle skeletal moves all extremities at baseline except right leg secondary to recent surgery grip strength is strong range of motion appears appropriate in the other 3 extremities.  Surgical site does have what I suspect is postop edema there is fairly minimal tenderness the area is covered with orders to remove the dressing on February 17 I do not see any surrounding erythema or sign of infection-there is some mild postop warmth to the area --But really minimal tenderness to the area.  Neurologic is grossly intact her speech is clear there are no lateralizing findings.  Psych- appears grossly alert and oriented x3 I suspect her dementia is quite mild -- is pleasant and appropriate           .  Labs.  12/11/2013.  WBC 10.1 hemoglobin 10.6 platelets 316.  Sodium 140 potassium 4 BUN 17 creatinine 0.6.  Assessment and plan.  #1-history of end-stage osteoarthritis status post right knee replacement-this appears to be stable she is on Xeralto for anticoagulation-at this point pain appears to be controlled. Tylenol  appears effective for pain she also is on Cymbalta which I suspect may be helping--she does have fairly significant right leg edema-I suspect this could  Very well be postop but would like to order a Doppler ultrasound to rule out anything more acute   #2-history of atrial fibrillation-this appears to be rate controlled she is on a beta blocker as well as anticoagulation with Alen Blew.  #3-history of dementia-she is on Aricept this  appears to be relatively mild.  #4-listed history of gastric ulcer in the past-she is on a proton pump inhibitor at this point appears to be stable.  #5-history of hypertension this so far this appears to be stable on current medication  #6-vaginal rash will treat with nystatin and monitor she's not really complaining of pain with this-.  #7-history of urinary frequency-he does have a history of uTIs per patient Will obtain a UA C&S.    History of depression??-she is on Cymbalta this appears to be stable.   [id number] note more than 35 minutes spent assessing patient-reviewing her hospital records-and coordinating and formulating a plan of care-of note greater than 50% of time spent coordinating plan of care

## 2013-12-13 ENCOUNTER — Ambulatory Visit (HOSPITAL_COMMUNITY)
Admission: RE | Admit: 2013-12-13 | Discharge: 2013-12-13 | Disposition: A | Discharge status: Skilled Nursing Facility | Payer: Medicare Other | Source: Skilled Nursing Facility | Attending: Internal Medicine | Admitting: Internal Medicine

## 2013-12-13 DIAGNOSIS — Z9889 Other specified postprocedural states: Secondary | ICD-10-CM | POA: Diagnosis not present

## 2013-12-13 DIAGNOSIS — M7989 Other specified soft tissue disorders: Secondary | ICD-10-CM | POA: Diagnosis present

## 2013-12-15 ENCOUNTER — Non-Acute Institutional Stay (SKILLED_NURSING_FACILITY): Payer: Medicare Other | Admitting: Internal Medicine

## 2013-12-15 DIAGNOSIS — I4891 Unspecified atrial fibrillation: Secondary | ICD-10-CM

## 2013-12-15 DIAGNOSIS — Z96659 Presence of unspecified artificial knee joint: Secondary | ICD-10-CM

## 2013-12-15 DIAGNOSIS — R32 Unspecified urinary incontinence: Secondary | ICD-10-CM

## 2013-12-15 NOTE — Progress Notes (Signed)
Patient ID: Tamara Mitchell, female   DOB: October 09, 1934, 80 y.o.   MRN: [id number] [facility] Chief complaint; admission to SNF post admit to [facility] 2/10 through 2/13. She was admitted electively for a right knee arthroplasty. She appears to have tolerated the procedure well. She was noted to have "drooping" of her left eyelid and 211 and there was concern for a possible stroke she underwent a CT scan of the head which was negative. She has apparently been discovered to have a UTI since her arrival here and has been put on ciprofloxacin. Also an extensive groin rash compatible with Candida  Past medical history/problem list #1 right total knee arthroplasty on the background of a previous left total knee arthroplasty #2 atrial fibrillation at on digoxin and xarelto #3 history of pulmonary hypertension and dilated right ventricle via past echocardiogram; the nature of this is not clear #4 history of a TIA per the patient #5 allergic rhinitis #6 history of depression #7 on Aricept presumably mild dementia #8 hypertension #9 gastroesophageal reflux disease #10 history of urinary frequency and incontinence  Medications; list is reviewed  Social history: Patient lives in her own home in the [place] which is in the process of being sold. She is moving to an independent apartment in [place]. She lives alone. Takes her own medications independent with ADLs and IADLs still driving per the patient   reports that she has never smoked. She has never used smokeless tobacco. She reports that she does not drink alcohol or use illicit drugs.   Review of systems; HEENT; patient tells me that her left eyelid tends to droop since she had a corneal implant surgery. She does not complain of diplopia visual changes other cranial nerve findings Respiratory; clear entry bilaterally Cardiac no complaints of chest pain GI no abdominal pain nausea vomiting or diarrhea GU she does  not complain of dysuria however she does have urinary frequency and incontinence.  Physical exam Gen.; the patient does not appear to be in any distress. Respiratory clear entry bilaterally Cardiac atrial fibrillation 2/6 systolic ejection murmur maximal at the aortic area no radiation no gallops no elevation of jugular venous pressure Abdomen no liver spleen or tenderness GU bladder is not distended Musculoskeletal right knee incision looks fine well approximated no evidence of infection Extremities no evidence of a DVT  Impression/plan #1  post right total arthroplasty she has tolerated the surgery well. #2 left ptosis. I can see nothing obvious that would make me concerned about this this is probably secondary to prior implant surgery #3 urinary incontinence without gross evidence of urinary retention. We'll do a bladder ultrasound and see if the patient might benefit from anticholinergic medication. #4 UTI on Cipro #5 extensive candidal groin rash currently on nystatin ointment #6 atrial fibrillation her heart rate is controlled and she is on xarelto chronically

## 2013-12-16 ENCOUNTER — Encounter (INDEPENDENT_AMBULATORY_CARE_PROVIDER_SITE_OTHER): Payer: Self-pay | Admitting: Internal Medicine

## 2013-12-20 ENCOUNTER — Ambulatory Visit (HOSPITAL_COMMUNITY): Payer: Medicare Other | Attending: Internal Medicine

## 2013-12-20 DIAGNOSIS — I10 Essential (primary) hypertension: Secondary | ICD-10-CM | POA: Insufficient documentation

## 2013-12-20 DIAGNOSIS — I4891 Unspecified atrial fibrillation: Secondary | ICD-10-CM | POA: Insufficient documentation

## 2013-12-20 DIAGNOSIS — J189 Pneumonia, unspecified organism: Secondary | ICD-10-CM | POA: Insufficient documentation

## 2013-12-22 ENCOUNTER — Non-Acute Institutional Stay (SKILLED_NURSING_FACILITY): Payer: Medicare Other | Admitting: Internal Medicine

## 2013-12-22 DIAGNOSIS — R509 Fever, unspecified: Secondary | ICD-10-CM

## 2013-12-22 DIAGNOSIS — R112 Nausea with vomiting, unspecified: Secondary | ICD-10-CM

## 2013-12-22 DIAGNOSIS — E876 Hypokalemia: Secondary | ICD-10-CM

## 2013-12-22 NOTE — Progress Notes (Signed)
Patient ID: Tamara Mitchell, female   DOB: 1934-01-13, 80 y.o.   MRN: [id number]     This is an acute visit.  Level of care skilled.  Facility [facility].  Chief complaint-acute visit secondary nausea and vomiting-fever.--Hypokalemia.  History of present illness.  Patient is a very pleasant 80 year old female here for rehabilitation after undergoing a right knee replacement secondary to severe end-stage osteoarthritis.  She tolerated the procedure well-her stay here has been complicated with a UTI she is just finished a course of amoxicillin for Proteus.  She's also appears to have called the virus going around the facility and has had nausea and vomiting.  Apparently overnight she did spike a temperature of slightly over 102-she currently is afebrile.  Labwork was obtained this morning which shows a mildly elevated white count of 11.1 there is no differential.  Metabolic panel was remarkable for potassium of 2.9.  She's not on any potassium although she is on an atenolol chlorothiadone combination 50-25.  Apparently she is not drinking real well either.  Currently she is denying any fever or chills shortness of breath cough diarrhea-nursing staff feels she's just weaker than she has been.  Family medical social history as been reviewed per history and physical on 12/15/2013.  Medications have been reviewed per MAR.  Review of systems.  In general she had a fever earlier today currently he is afebrile does not complaining of chills.  Respiratory-does not complaining of call for shortness of breath.  Cardiac history of atrial fibrillation but does not complaining of any chest pain or increased edema.  GI-as had nausea and vomiting no diarrhea-apparently this has gotten somewhat better today with no further episodes.  She is not complaining of any abdominal pain.  GU recently treated for UTI --she denies any dysuria.  Muscle skeletal-other than being weak has no acute complaints  of pain despite recent right knee surgery.  Neurologic-does not complaining of any headache dizziness syncopal-type feelings.  Psych history of mild dementia.  Physical exam.  Temperature 98.2 pulse 79 respirations 18 blood pressure 115/51.  In general this is a somewhat frail elderly female in no distress continues to be pleasant but appears to be a bit weaker than when I saw her on initial admission.  Her skin is warm and dry she is not diaphoretic.  Oropharynx clear mucous membranes moist.  Neck could not appreciate any adenopathy.  Chest is clear to auscultation there is no labored breathing.  Heart is regular irregular rate and rhythm with a 2/6 systolic murmur.  Abdomen is soft does not appear to be significantly tender there are positive bowel sounds.  GU could not really appreciate any suprapubic tenderness she does have a rash in the vaginal area being treated with topical cream.  Muscle skeletal-right knee surgical site appears quite benign well-healing surgical scar with no significant erythema or tenderness to palpation or increased warmth.  Neurologic-appears grossly intact her speech is clear she does have left eye proptosis that's apparently chronic.  Psych she appears grossly alert and oriented pleasant and appropriate.  Labs.  Feb- 25th 2015.  WBC 11.1 hemoglobin 10.9 platelets 395.  Sodium 140 potassium 2.9 BUN 24 creatinine 0.6 there'll.  Feb 23rd 2015.  TSH-0.371.  X-ray of the chest that showed bronchitic changes with minimal bibasilar atelectasis no acute infiltrate  Assessment and plan.  #1 history of nausea and vomiting and apparently is getting a bit better complicated with significant temperature earlier today that appears normalized now that there  could be a viral etiology for this.  However with her history of recent UTI will recollect a urine sample-recent chest x-ray did not show any acute etiology.  Will update a CBC with differential  on Friday, January 27 also monitor closely vital signs every 4 hours x3 and then every shift with pulse ox to keep an eye on this-she certainly does not appear overtly septic.  Secondary  Isuspecttof the nausea and vomiting she is not drinking well will give her a couple liters of IV normal saline D5 and recheck a metabolic panel on Friday as well.  #3 hypokalemia this is quite significant Will DC the diuretic portion of her blood pressure medicine-and give her potassium 40 mEq twice a day today and then 20 twice a day starting tomorrow and recheck a metabolic panel on Friday--also check magnesium level  [id number]

## 2013-12-23 ENCOUNTER — Encounter (HOSPITAL_BASED_OUTPATIENT_CLINIC_OR_DEPARTMENT_OTHER): Payer: Medicare Other | Attending: Internal Medicine

## 2013-12-23 DIAGNOSIS — R21 Rash and other nonspecific skin eruption: Secondary | ICD-10-CM | POA: Insufficient documentation

## 2013-12-23 DIAGNOSIS — E876 Hypokalemia: Secondary | ICD-10-CM | POA: Insufficient documentation

## 2013-12-23 DIAGNOSIS — R509 Fever, unspecified: Secondary | ICD-10-CM | POA: Insufficient documentation

## 2013-12-23 DIAGNOSIS — R112 Nausea with vomiting, unspecified: Secondary | ICD-10-CM | POA: Insufficient documentation

## 2013-12-24 ENCOUNTER — Ambulatory Visit (HOSPITAL_COMMUNITY): Payer: Medicare Other | Attending: Internal Medicine

## 2013-12-24 DIAGNOSIS — R059 Cough, unspecified: Secondary | ICD-10-CM | POA: Insufficient documentation

## 2013-12-24 DIAGNOSIS — R05 Cough: Secondary | ICD-10-CM | POA: Insufficient documentation

## 2013-12-27 ENCOUNTER — Telehealth: Payer: Self-pay | Admitting: Internal Medicine

## 2013-12-27 NOTE — Telephone Encounter (Signed)
Please call-need to know about her Xarelto please.

## 2013-12-27 NOTE — Telephone Encounter (Signed)
Returned call.  No answer/voicemail.  Will try later.  

## 2013-12-27 NOTE — Telephone Encounter (Signed)
Returned call to [place].  Stated she needed to know if pt is supposed to continue Xarelto after 3.6.71.  Stated pt has orders to give it to her until then and they were told to call our office to find out if pt is to continue Xarelto or not.  Will need an order.  Informed Dr. Debara Pickett will be notified for further instructions.    Ask for [facility] when calling back.  Message forwarded to Dr. Josepha Pigg, RN.

## 2013-12-28 NOTE — Telephone Encounter (Signed)
Yes .. She has chronic a-fib. She should be on xarelto lifelong, unless there are bleeding issues.  Please re-order.  Dr. Debara Pickett

## 2013-12-28 NOTE — Telephone Encounter (Signed)
Called Tamara Mitchell to inform of need to stay on xarelto. She will document in progress note. Patient has refills on file

## 2014-01-04 ENCOUNTER — Ambulatory Visit (HOSPITAL_COMMUNITY): Payer: No Typology Code available for payment source | Attending: Internal Medicine

## 2014-01-04 DIAGNOSIS — R918 Other nonspecific abnormal finding of lung field: Secondary | ICD-10-CM | POA: Insufficient documentation

## 2014-01-06 ENCOUNTER — Non-Acute Institutional Stay (SKILLED_NURSING_FACILITY): Payer: Medicare Other | Admitting: Internal Medicine

## 2014-01-06 ENCOUNTER — Encounter: Payer: Self-pay | Admitting: Internal Medicine

## 2014-01-06 DIAGNOSIS — E876 Hypokalemia: Secondary | ICD-10-CM

## 2014-01-06 DIAGNOSIS — K259 Gastric ulcer, unspecified as acute or chronic, without hemorrhage or perforation: Secondary | ICD-10-CM

## 2014-01-06 DIAGNOSIS — F0390 Unspecified dementia without behavioral disturbance: Secondary | ICD-10-CM

## 2014-01-06 DIAGNOSIS — I4891 Unspecified atrial fibrillation: Secondary | ICD-10-CM

## 2014-01-06 DIAGNOSIS — Z96659 Presence of unspecified artificial knee joint: Secondary | ICD-10-CM

## 2014-01-06 DIAGNOSIS — F329 Major depressive disorder, single episode, unspecified: Secondary | ICD-10-CM

## 2014-01-06 DIAGNOSIS — F32A Depression, unspecified: Secondary | ICD-10-CM

## 2014-01-06 DIAGNOSIS — F3289 Other specified depressive episodes: Secondary | ICD-10-CM

## 2014-01-06 NOTE — Progress Notes (Signed)
Patient ID: Tamara Mitchell, female   DOB: [birth year], 80 y.o.   MRN: [id number]   This is a discharge note.  Level of care skilled.  Facility [facility].  Chief complaint discharge note.  History of present illness   patient is a very pleasant 80 year old female who is here for rehabilitation after undergoing a right knee replacement secondary to end stage osteoarthritis.  Surgery went without complications-postop there was concern about some drooping of her eyelid however patient states this has been chronic ever since she had surgery for corneal implant. Her stay here has been relatively unremarkable she did catch it appears the GI virus going around the facility and had some nausea and vomiting which resolved unremarkably.  Also did lab work which showed a low potassium of 2.9 and this was supplemented we will need to update this level today-we did discontinue the diuretic portion of her blood pressure medication she does continue on atenolol for rate control with the history of atrial fibrillation   She is also on digoxin- Anticoagulation wise. she was discharged from the hospital onXeralto told she had been on this long term-appears per my review she has not been getting this for the last few days for unsure reason -- nursing staff did contact her cardiologist for clarification here and we will restartXeralto at 20 mg a day with followup by her primary care provider  She also developed UTI during her stay here and this was treated she does not complaining of dysuria today.  She will be going home to apparently an apartment she will be alone at times but will have someone with her at night-she appears to be ambulating well with a walker.  Family medical social history as been reviewed her admission no on February 15 and 12/15/2013.  Medications have been reviewed per [facility] include Tylenol-Aricept 10 mg-atenolol 50 mg a day.  Cymbalta 30 mg a day-reduction 125 mcg daily-probiotic-oxybutynin 5 mg  twice a day-potassium 20 mEq twice a day again this may have to be adjusted pending lab results.  Protonix 40 mg a day-  Review of systems.  In general denies any fever or chills says she feels well.  Skin did have a history of a rash in the vaginal area which appears to have resolved.  Head ears eyes nose mouth and throat-does not complaining of any visual changes hearing difficulties or nasal discharge.  Respiratory no complaints shortness breath or cough.  Cardiac history of atrial fibrillation but did not complaining of any chest pain or palpitations does have minimal lower extremity edema.  GI does not complaining of any nausea vomiting diarrhea constipation appears to have recovered from her GI virus.  GU has had some history of urinary frequency is on Ditropan twice a day.  Muscle skeletal-has done well with rehabilitation does not complaining of joint pain today.  Neurologic does not complaining of any dizziness headache or syncopal type episodes.  Physical exam.  Temperature is 98.1 pulse 58 respirations 22 blood pressure 119/78.  In general this is a pleasant elderly female in no distress.  Her skin is warm and dry.  Eyes pupils are equal round react to light sclera and conjunctiva are clear.  Visual acuity appears intact.  Oropharynx is clear mucous membranes moist.  Her chest is clear to auscultation there is no labored breathing.  Heart is irregular irregular rate and rhythm with a pulse of 58-she has trace lower extremity edema bilaterally.  GI abdomen soft nontender positive bowel sounds.  Muscle skeletal moves all extremities x4 it appears at baseline she is able to stand up without assistance and ambulate with a walker she also ambulates unassisted but appears to be somewhat unsteady with this.  Surgical site right knee appears to have healed unremarkably with a well-healed surgical scar no concerning edema erythema or warmth in the area.  Neurologic  is grossly intact her speech is clear no lateralizing findings.  Psych appears grossly alert and oriented pleasant and appropriate her dementia appears to be quite mild.  Labs.  12/29/2013.  Digoxin 0.77.  12/27/2013.  Sodium 141 potassium 3.6 BUN 15 creatinine 0.55.  Her weight 27 2015.  WBC 6.1 hemoglobin 10.2 platelets 352.  Sodium 141 potassium 3.1 BUN 22 creatinine 0.6.  Magnesium 1.7.  2- 25th 2015.  Notable for a potassium of 2.9.  Family 25th 2015.  TSH 0.371. 12/15/2013 urine grew out greater than 100,000 colonies of Proteus this was treated with amoxicillin.  Assessment and plan.  #1-history of end-stage osteoarthritis status post right knee replacement-she appears to have made a good recovery here will need PT and OT as outpatient her pain appears to be minimal-will need orthopedic followup as needed.  #2 history of atrial fibrillation-appears to be rate controlled on atenolol will be discharged on Xeralto--20 milligrams-nursing staff did contact cardiology about the recommended dose.  #3-history of hypokalemia this has been supplemented-Will update a BMP stat today to see where we stand we may have to reduce her potassium dose.  #4-dementia this appears to be quite mild she is on Aricept.  #5-he does have a listed history of a gastric ulcer in the past she is on a PPI this has been asymptomatic.  #6-history of hypertension-this appears stable despite being off her diuretic-.  #7-history of urinary frequency she has been started on Ditropan apparently this has improved.  #8 past history of depression she is on Cymbalta this appears to be quite stable.  Addendum-we have received the updated lab results-shows a sodium of 138 potassium of 4.6 BUN of 19 creatinine 0.62-Will maintain current potassium with followup by primary care provider  [phone] note greater than 30 minutes spent on this discharge summary-more than 50% of time spent coordinating plan  of care        . -

## 2014-01-07 ENCOUNTER — Other Ambulatory Visit: Payer: Self-pay | Admitting: *Deleted

## 2014-01-07 MED ORDER — DIGOXIN 125 MCG PO TABS: 0.1250 mg | ORAL_TABLET | Freq: Every day | ORAL | Status: DC

## 2014-01-07 NOTE — Telephone Encounter (Signed)
Rx was sent to pharmacy electronically. 

## 2014-02-03 ENCOUNTER — Telehealth (INDEPENDENT_AMBULATORY_CARE_PROVIDER_SITE_OTHER): Payer: Self-pay | Admitting: *Deleted

## 2014-02-03 ENCOUNTER — Encounter (HOSPITAL_COMMUNITY): Payer: Self-pay | Admitting: Emergency Medicine

## 2014-02-03 ENCOUNTER — Emergency Department (HOSPITAL_COMMUNITY)
Admission: EM | Admit: 2014-02-03 | Discharge: 2014-02-03 | Disposition: A | Discharge status: Home / Self Care | Payer: Medicare Other | Attending: Emergency Medicine | Admitting: Emergency Medicine

## 2014-02-03 DIAGNOSIS — F329 Major depressive disorder, single episode, unspecified: Secondary | ICD-10-CM | POA: Insufficient documentation

## 2014-02-03 DIAGNOSIS — D649 Anemia, unspecified: Secondary | ICD-10-CM

## 2014-02-03 DIAGNOSIS — F0390 Unspecified dementia without behavioral disturbance: Secondary | ICD-10-CM | POA: Insufficient documentation

## 2014-02-03 DIAGNOSIS — D689 Coagulation defect, unspecified: Secondary | ICD-10-CM

## 2014-02-03 DIAGNOSIS — K625 Hemorrhage of anus and rectum: Secondary | ICD-10-CM | POA: Insufficient documentation

## 2014-02-03 DIAGNOSIS — Z7901 Long term (current) use of anticoagulants: Secondary | ICD-10-CM | POA: Insufficient documentation

## 2014-02-03 DIAGNOSIS — F3289 Other specified depressive episodes: Secondary | ICD-10-CM | POA: Insufficient documentation

## 2014-02-03 DIAGNOSIS — I10 Essential (primary) hypertension: Secondary | ICD-10-CM | POA: Insufficient documentation

## 2014-02-03 DIAGNOSIS — I4891 Unspecified atrial fibrillation: Secondary | ICD-10-CM | POA: Insufficient documentation

## 2014-02-03 DIAGNOSIS — Z79899 Other long term (current) drug therapy: Secondary | ICD-10-CM | POA: Insufficient documentation

## 2014-02-03 LAB — BASIC METABOLIC PANEL
BUN: 17 mg/dL (ref 6–23)
CALCIUM: 9.6 mg/dL (ref 8.4–10.5)
CO2: 29 mEq/L (ref 19–32)
CREATININE: 0.6 mg/dL (ref 0.50–1.10)
Chloride: 99 mEq/L (ref 96–112)
GFR calc Af Amer: >90 mL/min (ref >90)
GFR, EST NON AFRICAN AMERICAN: 85 mL/min — AB (ref >90)
GLUCOSE: 105 mg/dL — AB (ref 70–99)
Potassium: 4.3 mEq/L (ref 3.7–5.3)
Sodium: 138 mEq/L (ref 137–147)

## 2014-02-03 LAB — URINALYSIS, ROUTINE W REFLEX MICROSCOPIC
BILIRUBIN URINE: NEGATIVE
Glucose, UA: NEGATIVE mg/dL
Ketones, ur: NEGATIVE mg/dL
Leukocytes, UA: NEGATIVE
Nitrite: NEGATIVE
SPECIFIC GRAVITY, URINE: 1.015 (ref 1.005–1.030)
Urobilinogen, UA: 2 mg/dL — ABNORMAL HIGH (ref 0.0–1.0)
pH: 7 (ref 5.0–8.0)

## 2014-02-03 LAB — CBC WITH DIFFERENTIAL/PLATELET
Basophils Absolute: 0 10*3/uL (ref 0.0–0.1)
Basophils Relative: 1 % (ref 0–1)
EOS PCT: 3 % (ref 0–5)
Eosinophils Absolute: 0.2 10*3/uL (ref 0.0–0.7)
HCT: 31.2 % — ABNORMAL LOW (ref 36.0–46.0)
Hemoglobin: 9.7 g/dL — ABNORMAL LOW (ref 12.0–15.0)
LYMPHS ABS: 1.2 10*3/uL (ref 0.7–4.0)
Lymphocytes Relative: 19 % (ref 12–46)
MCH: 27.5 pg (ref 26.0–34.0)
MCHC: 31.1 g/dL (ref 30.0–36.0)
MCV: 88.4 fL (ref 78.0–100.0)
MONO ABS: 0.6 10*3/uL (ref 0.1–1.0)
Monocytes Relative: 10 % (ref 3–12)
Neutro Abs: 4.2 10*3/uL (ref 1.7–7.7)
Neutrophils Relative %: 67 % (ref 43–77)
Platelets: 265 10*3/uL (ref 150–400)
RBC: 3.53 MIL/uL — AB (ref 3.87–5.11)
RDW: 18 % — ABNORMAL HIGH (ref 11.5–15.5)
WBC: 6.2 10*3/uL (ref 4.0–10.5)

## 2014-02-03 LAB — PROTIME-INR
INR: 2.24 — ABNORMAL HIGH (ref 0.00–1.49)
PROTHROMBIN TIME: 24.1 s — AB (ref 11.6–15.2)

## 2014-02-03 LAB — URINE MICROSCOPIC-ADD ON

## 2014-02-03 LAB — APTT: APTT: 40 s — AB (ref 24–37)

## 2014-02-03 NOTE — Discharge Instructions (Signed)
Stop your xarelto for now. Start taking aspirin 325 mg a day instead. Call Dr [facility] office and Dr Olevia Perches office to let them know about the bleeding. Return to the ED if you get heavy bleeding, you feel weak or dizzy like you are going to pass out or you feel worse.

## 2014-02-03 NOTE — ED Notes (Signed)
Patient denies any dizziness upon sitting or standing.  

## 2014-02-03 NOTE — Telephone Encounter (Signed)
Tamara Mitchell said she had a TCS  September of 2014 and Dr. Laural Golden was unable to complete it. She is now bleeding from the mouth and rectum. Patient was advised to go to the ED to be evaluated.

## 2014-02-03 NOTE — Telephone Encounter (Signed)
Noted. Patient did go to the ED to be evaluated.

## 2014-02-03 NOTE — ED Provider Notes (Signed)
CSN: [id number]     Arrival date & time 02/03/14  1144 History   First MD Initiated Contact with Patient 02/03/14 1202     Chief Complaint  Patient presents with  . GI Bleeding     (Consider location/radiation/quality/duration/timing/severity/associated sxs/prior Treatment) HPI Patient reports she had right total knee replacement done February 10th of this year and she is still on Xarelto for her atrial fibrillation. She reports she's been having rectal bleeding at every bowel movement for the past couple of weeks. She also states she wakes up in the morning and she has blood in her mouth and on her pillow. She denies any abdominal pain, nausea, vomiting, or feeling dizzy. She does report she feels weak. She states she's never had bleeding problems before. She estimates she is passing a half a cup of blood with each episode.  PCP [facility] Dr Laural Golden  Past Medical History  Diagnosis Date  . Hypertension   . Atrial fibrillation     digoxin, coumadin  . Mild dementia   . Mitral insufficiency   . Tricuspid insufficiency   . Moderate to severe pulmonary hypertension     moderate  . Chronic venous insufficiency   . History of nuclear stress test 12/12/05/2010    lexiscan; non-diagnostic for ischemia, low risk   . Depression    Past Surgical History  Procedure Laterality Date  . Cholecystectomy  2006  . Total knee arthroplasty Left 2013  . Tonsillectomy    . Colonoscopy with esophagogastroduodenoscopy (egd) N/A 06/22/2013    Procedure: COLONOSCOPY WITH ESOPHAGOGASTRODUODENOSCOPY (EGD);  Surgeon: Rogene Houston, MD;  [facility]: [facility];  Service: Endoscopy;  Laterality: N/A;  730  . Transthoracic echocardiogram  10/24/2012    EF=50-55%, mod conc LVH; RV mod-severely dilated & systolic function mildly reduced; LA severely dialted, possible small PFO; mild mitral annular calcif & mild MR; mod TR & elevated RV systolic pressure, mod pulm HTN; AV mildyl sclerotic & mild-mod regurg  (performed for murmur & afib)   No family history on file. History  Substance Use Topics  . Smoking status: Never Smoker   . Smokeless tobacco: Never Used  . Alcohol Use: No  using a cane after TKR Lives at home   OB History   Grav Para Term Preterm Abortions TAB SAB Ect Mult Living                 Review of Systems  All other systems reviewed and are negative.     Allergies  Review of patient's allergies indicates no known allergies.  Home Medications   Current Outpatient Rx  Name  Route  Sig  Dispense  Refill  . atenolol (TENORMIN) 50 MG tablet   Oral   Take 50 mg by mouth daily.         . digoxin (LANOXIN) 0.125 MG tablet   Oral   Take 1 tablet (0.125 mg total) by mouth daily.   30 tablet   6   . donepezil (ARICEPT) 10 MG tablet   Oral   Take 10 mg by mouth at bedtime.         . DULoxetine (CYMBALTA) 30 MG capsule   Oral   Take 30 mg by mouth daily.         . fluticasone (FLONASE) 50 MCG/ACT nasal spray   Nasal   Place 2 sprays into the nose daily as needed.          . pantoprazole (PROTONIX) 40  MG tablet   Oral   Take 1 tablet (40 mg total) by mouth daily.   60 tablet   5   . Rivaroxaban (XARELTO) 20 MG TABS tablet   Oral   Take 20 mg by mouth daily.           BP 134/60  Pulse 69  Temp(Src) 97.6 F (36.4 C) (Oral)  Resp 18  Ht 5\' 4"  (1.626 m)  Wt 145 lb (65.772 kg)  BMI 24.88 kg/m2  SpO2 97%  Vital signs normal   Physical Exam  Nursing note and vitals reviewed. Constitutional: She is oriented to person, place, and time. She appears well-developed and well-nourished.  Non-toxic appearance. She does not appear ill. No distress.  HENT:  Head: Normocephalic and atraumatic.  Right Ear: External ear normal.  Left Ear: External ear normal.  Nose: Nose normal. No mucosal edema or rhinorrhea.  Mouth/Throat: Oropharynx is clear and moist and mucous membranes are normal. No dental abscesses or uvula swelling.  Trace amount of  blood pooling underneath her tongue, but no obvious bleeding sites seen  Eyes: Conjunctivae and EOM are normal. Pupils are equal, round, and reactive to light.  Neck: Normal range of motion and full passive range of motion without pain. Neck supple.  Cardiovascular: Normal rate, regular rhythm and normal heart sounds.  Exam reveals no gallop and no friction rub.   No murmur heard. Pulmonary/Chest: Effort normal and breath sounds normal. No respiratory distress. She has no wheezes. She has no rhonchi. She has no rales. She exhibits no tenderness and no crepitus.  Abdominal: Soft. Normal appearance and bowel sounds are normal. She exhibits no distension. There is no tenderness. There is no rebound and no guarding.  Genitourinary:  Stool in vault, blood on glove  Musculoskeletal: Normal range of motion. She exhibits no edema and no tenderness.  Moves all extremities well.   Neurological: She is alert and oriented to person, place, and time. She has normal strength. No cranial nerve deficit.  Skin: Skin is warm, dry and intact. No rash noted. No erythema. No pallor.  Psychiatric: She has a normal mood and affect. Her speech is normal and behavior is normal. Her mood appears not anxious.    ED Course  Procedures (including critical care time)  Medications - No data to display  Pt's orthostatic VS are normal. She had no bleeding while in the ED.   14:32 D/W Dr Sheran Fava, feels since her orthostatics are normal and her anemia is unchanged she can be discharged home to see her GI and Cardiologist, recommends stopping the xarelto for now and place on ASA.   Labs Review Results for orders placed during the hospital encounter of 02/03/14  URINALYSIS, ROUTINE W REFLEX MICROSCOPIC      Result Value Ref Range   Color, Urine YELLOW  YELLOW   APPearance CLEAR  CLEAR   Specific Gravity, Urine 1.015  1.005 - 1.030   pH 7.0  5.0 - 8.0   Glucose, UA NEGATIVE  NEGATIVE mg/dL   Hgb urine dipstick TRACE (*)  NEGATIVE   Bilirubin Urine NEGATIVE  NEGATIVE   Ketones, ur NEGATIVE  NEGATIVE mg/dL   Protein, ur TRACE (*) NEGATIVE mg/dL   Urobilinogen, UA 2.0 (*) 0.0 - 1.0 mg/dL   Nitrite NEGATIVE  NEGATIVE   Leukocytes, UA NEGATIVE  NEGATIVE  CBC WITH DIFFERENTIAL      Result Value Ref Range   WBC 6.2  4.0 - 10.5 K/uL   RBC 3.53 (*)  3.87 - 5.11 MIL/uL   Hemoglobin 9.7 (*) 12.0 - 15.0 g/dL   HCT 31.2 (*) 36.0 - 46.0 %   MCV 88.4  78.0 - 100.0 fL   MCH 27.5  26.0 - 34.0 pg   MCHC 31.1  30.0 - 36.0 g/dL   RDW 18.0 (*) 11.5 - 15.5 %   Platelets 265  150 - 400 K/uL   Neutrophils Relative % 67  43 - 77 %   Neutro Abs 4.2  1.7 - 7.7 K/uL   Lymphocytes Relative 19  12 - 46 %   Lymphs Abs 1.2  0.7 - 4.0 K/uL   Monocytes Relative 10  3 - 12 %   Monocytes Absolute 0.6  0.1 - 1.0 K/uL   Eosinophils Relative 3  0 - 5 %   Eosinophils Absolute 0.2  0.0 - 0.7 K/uL   Basophils Relative 1  0 - 1 %   Basophils Absolute 0.0  0.0 - 0.1 K/uL  BASIC METABOLIC PANEL      Result Value Ref Range   Sodium 138  137 - 147 mEq/L   Potassium 4.3  3.7 - 5.3 mEq/L   Chloride 99  96 - 112 mEq/L   CO2 29  19 - 32 mEq/L   Glucose, Bld 105 (*) 70 - 99 mg/dL   BUN 17  6 - 23 mg/dL   Creatinine, Ser 0.60  0.50 - 1.10 mg/dL   Calcium 9.6  8.4 - 10.5 mg/dL   GFR calc non Af Amer 85 (*) >90 mL/min   GFR calc Af Amer >90  >90 mL/min  APTT      Result Value Ref Range   aPTT 40 (*) 24 - 37 seconds  PROTIME-INR      Result Value Ref Range   Prothrombin Time 24.1 (*) 11.6 - 15.2 seconds   INR 2.24 (*) 0.00 - 1.49  URINE MICROSCOPIC-ADD ON      Result Value Ref Range   Squamous Epithelial / LPF RARE  RARE   WBC, UA 0-2  <3 WBC/hpf   RBC / HPF 3-6  <3 RBC/hpf   Bacteria, UA MANY (*) RARE   Casts HYALINE CASTS (*) NEGATIVE   Laboratory interpretation all normal except coagulopathy, stable anemia    Imaging Review No results found.   EKG Interpretation   Date/Time:  Thursday February 03 2014 12:07:12  EDT Ventricular Rate:  66 PR Interval:    QRS Duration: 116 QT Interval:  410 QTC Calculation: 429 R Axis:   143 Text Interpretation:  Atrial fibrillation Right bundle branch block T wave  abnormality, consider inferior ischemia When compared with ECG of  06-May-2013 15:04, Criteria for Septal infarct are no longer Present  Baseline wander Confirmed by Damariz Paganelli  MD-I, Symphani Eckstrom ([id number]) on 02/03/2014 12:12:27  PM      MDM   Final diagnoses:  Rectal bleeding  Anemia  Coagulopathy   Plan discharge  Rolland Porter, MD, Alanson Aly, MD 02/03/14 [phone]

## 2014-02-03 NOTE — ED Notes (Signed)
Pt reports had knee surgery in Feb and was in rehab at [facility] center until Mar 12.  Reports had colonoscopy in Sept 2014 and was told she had a polyp that they were not able to remove completely.  Reports started having dark red blood in stool a few weeks ago and when passes gas.  Also reports has woken up a few times with dried blood around mouth.

## 2014-02-11 ENCOUNTER — Encounter: Payer: Self-pay | Admitting: Internal Medicine

## 2014-02-11 ENCOUNTER — Ambulatory Visit (INDEPENDENT_AMBULATORY_CARE_PROVIDER_SITE_OTHER): Payer: Medicare Other | Admitting: Internal Medicine

## 2014-02-11 VITALS — BP 122/70 | HR 66 | Ht 64.0 in | Wt 147.1 lb

## 2014-02-11 DIAGNOSIS — I4891 Unspecified atrial fibrillation: Secondary | ICD-10-CM

## 2014-02-11 DIAGNOSIS — I83893 Varicose veins of bilateral lower extremities with other complications: Secondary | ICD-10-CM

## 2014-02-11 DIAGNOSIS — K259 Gastric ulcer, unspecified as acute or chronic, without hemorrhage or perforation: Secondary | ICD-10-CM

## 2014-02-11 DIAGNOSIS — K922 Gastrointestinal hemorrhage, unspecified: Secondary | ICD-10-CM | POA: Insufficient documentation

## 2014-02-11 DIAGNOSIS — I10 Essential (primary) hypertension: Secondary | ICD-10-CM

## 2014-02-11 NOTE — Patient Instructions (Signed)
Your physician wants you to follow-up in:  6 months. You will receive a reminder letter in the mail two months in advance. If you don't receive a letter, please call our office to schedule the follow-up appointment.   

## 2014-02-11 NOTE — Progress Notes (Signed)
OFFICE NOTE  Chief Complaint:  Routine followup  Primary Care Physician: Delphina Cahill, MD  HPI:  Tamara Mitchell is a 80 year old female with a history of chronic atrial fibrillation which is permanent and on Coumadin and digoxin, also a history of recent knee surgery which she tolerated well. She has been dizzy from time to time, and recently had a fall with scalp laceration. She did not have a CT scan to evaluate for any intracranial bleeding. She's also had recent anemia and was thought to be due to GI blood loss. Her INRs were difficult to regulate and she was switched to Xarelto. Unfortunately she had a GI bleeding episode and underwent endoscopy which showed a bleeding ulcer that was treated. This ulcer resolved and her Xarelto was restarted.  Last week she had recurrent GI bleeding and presented to the emergency room. She reported having about a cupful of bright red blood per time as well as upper GI bleeding. Her Xarelto was stopped and amazingly she was not admitted to the hospital for observation. She was placed on full dose aspirin and follows up with me today. She reports she's had no further significant upper or lower GI bleeding.  PMHx:  Past Medical History  Diagnosis Date  . Hypertension   . Atrial fibrillation     digoxin, coumadin  . Mild dementia   . Mitral insufficiency   . Tricuspid insufficiency   . Moderate to severe pulmonary hypertension     moderate  . Chronic venous insufficiency   . History of nuclear stress test 12/12/05/2010    lexiscan; non-diagnostic for ischemia, low risk   . Depression     Past Surgical History  Procedure Laterality Date  . Cholecystectomy  2006  . Total knee arthroplasty Left 2013  . Tonsillectomy    . Colonoscopy with esophagogastroduodenoscopy (egd) N/A 06/22/2013    Procedure: COLONOSCOPY WITH ESOPHAGOGASTRODUODENOSCOPY (EGD);  Surgeon: Rogene Houston, MD;  [facility]: [facility];  Service: Endoscopy;  Laterality: N/A;  730  .  Transthoracic echocardiogram  10/24/2012    EF=50-55%, mod conc LVH; RV mod-severely dilated & systolic function mildly reduced; LA severely dialted, possible small PFO; mild mitral annular calcif & mild MR; mod TR & elevated RV systolic pressure, mod pulm HTN; AV mildyl sclerotic & mild-mod regurg (performed for murmur & afib)    FAMHx:  History reviewed. No pertinent family history.  SOCHx:   reports that she has never smoked. She has never used smokeless tobacco. She reports that she does not drink alcohol or use illicit drugs.  ALLERGIES:  No Known Allergies  ROS: A comprehensive review of systems was negative except for: Constitutional: positive for fatigue Cardiovascular: positive for irregular heart beat Gastrointestinal: positive for GI Bleeding  HOME MEDS: Current Outpatient Prescriptions  Medication Sig Dispense Refill  . aspirin 325 MG EC tablet Take 325 mg by mouth daily.      Marland Kitchen atenolol (TENORMIN) 50 MG tablet Take 50 mg by mouth daily.      . digoxin (LANOXIN) 0.125 MG tablet Take 1 tablet (0.125 mg total) by mouth daily.  30 tablet  6  . donepezil (ARICEPT) 10 MG tablet Take 10 mg by mouth at bedtime.      . DULoxetine (CYMBALTA) 30 MG capsule Take 30 mg by mouth daily.      . fluticasone (FLONASE) 50 MCG/ACT nasal spray Place 2 sprays into the nose daily as needed.       Marland Kitchen  pantoprazole (PROTONIX) 40 MG tablet Take 1 tablet (40 mg total) by mouth daily.  60 tablet  5   No current facility-administered medications for this visit.    LABS/IMAGING: No results found for this or any previous visit (from the past 48 hour(s)). No results found.  VITALS: BP 122/70  Pulse 66  Ht 5\' 4"  (1.626 m)  Wt 147 lb 1.6 oz (66.724 kg)  BMI 25.24 kg/m2  EXAM: General appearance: alert and no distress Neck: no adenopathy, no carotid bruit, no JVD, supple, symmetrical, trachea midline and thyroid not enlarged, symmetric, no tenderness/mass/nodules Lungs: clear to auscultation  bilaterally Heart: regular rate and rhythm, S1, S2 normal, no murmur, click, rub or gallop Abdomen: soft, non-tender; bowel sounds normal; no masses,  no organomegaly Extremities: extremities normal, atraumatic, no cyanosis or edema, varicose veins noted, venous stasis dermatitis noted and resolved ulcer Pulses: 2+ and symmetric Skin: CEAP 1,2,3,4a,6 (left) Neurologic: Mental status: Alert, oriented, thought content appropriate Psych:  Mood and affect normal  EKG: Atrial fibrillation at 66, RBBB  ASSESSMENT: 1. Recurrent GI bleeding - history of gastric ulcer 2. Chronic atrial fibrillation on Xarelto - now being held due to GI Bleeding 3. Bilateral venous insufficiency with nonhealing left ulcer 4. Hypertension-well-controlled  PLAN: 1.   Mrs. Rinehimer had another episode of GI bleeding which sounds like both upper and lower GI bleeding. This could be a brisk gastric ulcer bleed. She is now off of Xarelto and was not admitted to the hospital for her bleeding. She is scheduled to see Dr. Laural Golden next week and may need another endoscopy. She is at high risk for stroke and her aspirin is not sufficient anticoagulation. I'm hesitant to start her back on stronger anticoagulants if she has a poorly healed ulcer or other source of GI bleeding that could act up. I will like her to be evaluated first and we could consider an alternative anticoagulant such as Eliquis or Savaysa.  Pixie Casino, MD, [facility] Attending Cardiologist [facility] 02/11/2014, 10:46 AM

## 2014-02-14 ENCOUNTER — Encounter (INDEPENDENT_AMBULATORY_CARE_PROVIDER_SITE_OTHER): Payer: Self-pay | Admitting: Internal Medicine

## 2014-02-14 ENCOUNTER — Other Ambulatory Visit (INDEPENDENT_AMBULATORY_CARE_PROVIDER_SITE_OTHER): Payer: Self-pay | Admitting: *Deleted

## 2014-02-14 ENCOUNTER — Ambulatory Visit (INDEPENDENT_AMBULATORY_CARE_PROVIDER_SITE_OTHER): Payer: Medicare Other | Admitting: Internal Medicine

## 2014-02-14 ENCOUNTER — Telehealth (INDEPENDENT_AMBULATORY_CARE_PROVIDER_SITE_OTHER): Payer: Self-pay | Admitting: *Deleted

## 2014-02-14 VITALS — BP 90/64 | HR 64 | Temp 98.1°F | Ht 64.0 in | Wt 149.3 lb

## 2014-02-14 DIAGNOSIS — K621 Rectal polyp: Secondary | ICD-10-CM

## 2014-02-14 DIAGNOSIS — Z1211 Encounter for screening for malignant neoplasm of colon: Secondary | ICD-10-CM

## 2014-02-14 DIAGNOSIS — K625 Hemorrhage of anus and rectum: Secondary | ICD-10-CM

## 2014-02-14 MED ORDER — PEG-KCL-NACL-NASULF-NA ASC-C 100 G PO SOLR: 1.0000 | Freq: Once | ORAL | Status: DC

## 2014-02-14 NOTE — Progress Notes (Signed)
Subjective:     Patient ID: Tamara Mitchell, female   DOB: 10-12-1934, 80 y.o.   MRN: [id number]  HPI Recently seen in the ED (02/03/2014) for rectal bleeding. She also had bleeding from her mouth. She says she had bleeding from her mouth. She did not vomit. She also had rectal bleeding. She continues to have rectal bleeding. Bright red. REctal bleeding occuring before her knee surgery in February of this year.  Taken off Xarelto after visit from the ED and placed on ASA 325mg .  Hx of atrial fib. She denies any abdominal pain. Appetite is good. Her BMs are bright yellow. No rectal bleeding since stopping the Xarelto after ED visit. She says she does have some constipation and takes OTC stool softener. 06/22/2013 colonoscopy was incomplete. She was suppose to have another colonoscopy in 3 months but had problems with a left leg wound and had to follow up with the wound clinic.       CBC    Component Value Date/Time   WBC 6.2 02/03/2014 1224   RBC 3.53* 02/03/2014 1224   HGB 9.7* 02/03/2014 1224   HCT 31.2* 02/03/2014 1224   PLT 265 02/03/2014 1224   MCV 88.4 02/03/2014 1224   MCH 27.5 02/03/2014 1224   MCHC 31.1 02/03/2014 1224   RDW 18.0* 02/03/2014 1224   LYMPHSABS 1.2 02/03/2014 1224   MONOABS 0.6 02/03/2014 1224   EOSABS 0.2 02/03/2014 1224   BASOSABS 0.0 02/03/2014 1224      06/22/2013 EGD/Colonoscopy: Impression:  Mild changes of reflux esophagitis limited to GE junction.  Small sliding hiatal hernia.  Large antral scar with focal gastritis.  5 mm pyloric channel ulcer.  Incomplete colonoscopy to hepatic flexure.  Large complex polyp at distal rectum. Piecemeal polypectomy performed. Polypectomy site treated with argon plasma coagulator. Polypectomy incomplete.  H. Pylori negative. 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 Results reviewed the patient. She will resume Xeralto on 06/26/2013. Begin ferrous sulfate 325 mg by mouth daily. Repeat Flexible  sigmoidoscopy with polypectomy in 3 months Path report to Dr. Wende Neighbors.      Review of Systems Past Medical History  Diagnosis Date  . Hypertension   . Atrial fibrillation     digoxin, coumadin  . Mild dementia   . Mitral insufficiency   . Tricuspid insufficiency   . Moderate to severe pulmonary hypertension     moderate  . Chronic venous insufficiency   . History of nuclear stress test 12/12/05/2010    lexiscan; non-diagnostic for ischemia, low risk   . Depression     Past Surgical History  Procedure Laterality Date  . Cholecystectomy  2006  . Total knee arthroplasty Left 2013  . Tonsillectomy    . Colonoscopy with esophagogastroduodenoscopy (egd) N/A 06/22/2013    Procedure: COLONOSCOPY WITH ESOPHAGOGASTRODUODENOSCOPY (EGD);  Surgeon: Rogene Houston, MD;  [facility];  Service: Endoscopy;  Laterality: N/A;  730  . Transthoracic echocardiogram  10/24/2012    EF=50-55%, mod conc LVH; RV mod-severely dilated & systolic function mildly reduced; LA severely dialted, possible small PFO; mild mitral annular calcif & mild MR; mod TR & elevated RV systolic pressure, mod pulm HTN; AV mildyl sclerotic & mild-mod regurg (performed for murmur & afib)    No Known Allergies  Current Outpatient Prescriptions on File Prior to Visit  Medication Sig Dispense Refill  . aspirin 325 MG EC tablet Take 325 mg by mouth daily.      Marland Kitchen  atenolol (TENORMIN) 50 MG tablet Take 50 mg by mouth daily.      . digoxin (LANOXIN) 0.125 MG tablet Take 1 tablet (0.125 mg total) by mouth daily.  30 tablet  6  . donepezil (ARICEPT) 10 MG tablet Take 10 mg by mouth at bedtime.      . DULoxetine (CYMBALTA) 30 MG capsule Take 30 mg by mouth daily.      . fluticasone (FLONASE) 50 MCG/ACT nasal spray Place 2 sprays into the nose daily as needed.       . pantoprazole (PROTONIX) 40 MG tablet Take 1 tablet (40 mg total) by mouth daily.  60 tablet  5   No current facility-administered medications on file  prior to visit.        Objective:   Physical Exam  Filed Vitals:   02/14/14 1024  BP: 90/64  Pulse: 64  Temp: 98.1 F (36.7 C)  Height: 5\' 4"  (1.626 m)  Weight: 149 lb 4.8 oz (67.722 kg)   Alert and oriented. Skin warm and dry. Oral mucosa is moist.   . Sclera anicteric, conjunctivae is pink. Thyroid not enlarged. No cervical lymphadenopathy. Lungs clear. Heart regular rate and rhythm.  Abdomen is soft. Bowel sounds are positive. No hepatomegaly. No abdominal masses felt. No tenderness.  No edema to lower extremities. Rectal exam deferred.      Assessment:    Rectal bleeding resolved. Known hx of incomplete polypectomy for a large polyp in August of 2014.    Plan:     Sigmoidoscopy/polopectomy. I discussed this case with Dr Laural Golden.

## 2014-02-14 NOTE — Telephone Encounter (Signed)
Patient needs movi prep 

## 2014-02-14 NOTE — Patient Instructions (Signed)
Miralax 1 scoop daily. Sigmoidoscopy with full prep with full prep

## 2014-02-17 ENCOUNTER — Telehealth: Payer: Self-pay | Admitting: Internal Medicine

## 2014-02-17 NOTE — Telephone Encounter (Signed)
Message forwarded to Dr. Josepha Pigg, RN.

## 2014-02-17 NOTE — Telephone Encounter (Signed)
She wanted Dr Debara Pickett to know she went to her GI doctor Monday. He decided to do a colonoscopy on 03-14-14. She is concerned about whether taking 325 mg of aspirin is going to be all right for her to continue to take.

## 2014-02-23 NOTE — Telephone Encounter (Signed)
Often times aspirin is held for 7 days prior to colonoscopy. It is up to her GI doctor.  -Dr. Lemmie Evens

## 2014-02-24 NOTE — Telephone Encounter (Signed)
Message forwarded to Jenna, RN.  

## 2014-02-24 NOTE — Telephone Encounter (Signed)
LMTCB

## 2014-02-25 NOTE — Telephone Encounter (Signed)
Patient informed of advice per Dr. Debara Pickett. Voiced understanding.

## 2014-02-25 NOTE — Telephone Encounter (Signed)
LMTCB

## 2014-02-25 NOTE — Telephone Encounter (Signed)
Returning your call from yesterday. °

## 2014-02-28 ENCOUNTER — Encounter (HOSPITAL_COMMUNITY): Payer: Self-pay | Admitting: Pharmacy Technician

## 2014-03-14 ENCOUNTER — Encounter (HOSPITAL_COMMUNITY)
Admission: RE | Disposition: A | Discharge status: Home / Self Care | Payer: Self-pay | Source: Ambulatory Visit | Attending: Internal Medicine

## 2014-03-14 ENCOUNTER — Encounter (HOSPITAL_COMMUNITY): Payer: Self-pay | Admitting: *Deleted

## 2014-03-14 ENCOUNTER — Ambulatory Visit (HOSPITAL_COMMUNITY)
Admission: RE | Admit: 2014-03-14 | Discharge: 2014-03-14 | Disposition: A | Discharge status: Home / Self Care | Payer: Medicare Other | Source: Ambulatory Visit | Attending: Internal Medicine | Admitting: Internal Medicine

## 2014-03-14 ENCOUNTER — Ambulatory Visit (HOSPITAL_COMMUNITY): Payer: Medicare Other

## 2014-03-14 DIAGNOSIS — Z96659 Presence of unspecified artificial knee joint: Secondary | ICD-10-CM | POA: Insufficient documentation

## 2014-03-14 DIAGNOSIS — Z860100 Personal history of colon polyps, unspecified: Secondary | ICD-10-CM | POA: Insufficient documentation

## 2014-03-14 DIAGNOSIS — I10 Essential (primary) hypertension: Secondary | ICD-10-CM | POA: Insufficient documentation

## 2014-03-14 DIAGNOSIS — K621 Rectal polyp: Secondary | ICD-10-CM

## 2014-03-14 DIAGNOSIS — C20 Malignant neoplasm of rectum: Secondary | ICD-10-CM

## 2014-03-14 DIAGNOSIS — K626 Ulcer of anus and rectum: Secondary | ICD-10-CM | POA: Insufficient documentation

## 2014-03-14 DIAGNOSIS — I4891 Unspecified atrial fibrillation: Secondary | ICD-10-CM | POA: Insufficient documentation

## 2014-03-14 DIAGNOSIS — F0390 Unspecified dementia without behavioral disturbance: Secondary | ICD-10-CM | POA: Insufficient documentation

## 2014-03-14 HISTORY — PX: POLYPECTOMY: SHX5525

## 2014-03-14 HISTORY — PX: FLEXIBLE SIGMOIDOSCOPY: SHX5431

## 2014-03-14 SURGERY — SIGMOIDOSCOPY, FLEXIBLE
Anesthesia: Moderate Sedation

## 2014-03-14 MED ORDER — IOHEXOL 300 MG/ML  SOLN
50.0000 mL | INTRAMUSCULAR | Status: AC
  Administered 2014-03-14: 50 mL via ORAL

## 2014-03-14 MED ORDER — MEPERIDINE HCL 50 MG/ML IJ SOLN
INTRAMUSCULAR | Status: AC
  Filled 2014-03-14: qty 1

## 2014-03-14 MED ORDER — DEXTROSE 5 % IV SOLN
2.0000 g | Freq: Once | INTRAVENOUS | Status: DC
  Filled 2014-03-14: qty 20

## 2014-03-14 MED ORDER — SODIUM CHLORIDE 0.9 % IV SOLN
INTRAVENOUS | Status: DC
  Administered 2014-03-14: 07:00:00 via INTRAVENOUS

## 2014-03-14 MED ORDER — CEFAZOLIN SODIUM-DEXTROSE 2-3 GM-% IV SOLR
2.0000 g | Freq: Once | INTRAVENOUS | Status: AC
  Administered 2014-03-14: 2 g via INTRAVENOUS

## 2014-03-14 MED ORDER — CEFAZOLIN SODIUM-DEXTROSE 2-3 GM-% IV SOLR
INTRAVENOUS | Status: AC
  Filled 2014-03-14: qty 50

## 2014-03-14 MED ORDER — MIDAZOLAM HCL 5 MG/5ML IJ SOLN
INTRAMUSCULAR | Status: DC | PRN
  Administered 2014-03-14: 1 mg via INTRAVENOUS
  Administered 2014-03-14: 2 mg via INTRAVENOUS

## 2014-03-14 MED ORDER — MIDAZOLAM HCL 5 MG/5ML IJ SOLN
INTRAMUSCULAR | Status: AC
  Filled 2014-03-14: qty 10

## 2014-03-14 MED ORDER — STERILE WATER FOR IRRIGATION IR SOLN
Status: DC | PRN
  Administered 2014-03-14: 08:00:00

## 2014-03-14 MED ORDER — SODIUM CHLORIDE 0.9 % IJ SOLN
INTRAMUSCULAR | Status: AC
  Filled 2014-03-14: qty 500

## 2014-03-14 MED ORDER — IOHEXOL 300 MG/ML  SOLN
100.0000 mL | Freq: Once | INTRAMUSCULAR | Status: AC | PRN
  Administered 2014-03-14: 100 mL via INTRAVENOUS

## 2014-03-14 MED ORDER — MEPERIDINE HCL 25 MG/ML IJ SOLN
INTRAMUSCULAR | Status: DC | PRN
  Administered 2014-03-14: 25 mg via INTRAVENOUS

## 2014-03-14 NOTE — Discharge Instructions (Signed)
Chest and abdominopelvic CT to be done today before discharge. Resume usual medications and diet. No driving for 24 hours. Physician will call with results of CT and biopsy.  Colonoscopy, Care After Refer to this sheet in the next few weeks. These instructions provide you with information on caring for yourself after your procedure. Your health care provider may also give you more specific instructions. Your treatment has been planned according to current medical practices, but problems sometimes occur. Call your health care provider if you have any problems or questions after your procedure. WHAT TO EXPECT AFTER THE PROCEDURE  After your procedure, it is typical to have the following:  A small amount of blood in your stool.  Moderate amounts of gas and mild abdominal cramping or bloating. HOME CARE INSTRUCTIONS  Do not drive, operate machinery, or sign important documents for 24 hours.  You may shower and resume your regular physical activities, but move at a slower pace for the first 24 hours.  Take frequent rest periods for the first 24 hours.  Walk around or put a warm pack on your abdomen to help reduce abdominal cramping and bloating.  Drink enough fluids to keep your urine clear or pale yellow.  You may resume your normal diet as instructed by your health care provider. Avoid heavy or fried foods that are hard to digest.  Avoid drinking alcohol for 24 hours or as instructed by your health care provider.  Only take over-the-counter or prescription medicines as directed by your health care provider.  If a tissue sample (biopsy) was taken during your procedure:  Do not take aspirin or blood thinners for 7 days, or as instructed by your health care provider.  Do not drink alcohol for 7 days, or as instructed by your health care provider.  Eat soft foods for the first 24 hours. SEEK MEDICAL CARE IF: You have persistent spotting of blood in your stool 2 3 days after the  procedure. SEEK IMMEDIATE MEDICAL CARE IF:  You have more than a small spotting of blood in your stool.  You pass large blood clots in your stool.  Your abdomen is swollen (distended).  You have nausea or vomiting.  You have a fever.  You have increasing abdominal pain that is not relieved with medicine. Document Released: 05/28/2004 Document Revised: 08/04/2013 Document Reviewed: 06/21/2013 [employer] Patient Information 2014 [place].

## 2014-03-14 NOTE — OR Nursing (Signed)
Cousin with pt, moved pt to room 2, CT bringing contrast, pt will have to drank and wait 2 hours for scan. Pt and family aware

## 2014-03-14 NOTE — H&P (Signed)
Tamara Mitchell is an 80 y.o. female.   Chief Complaint: Patient's here for flexible sigmoidoscopy. HPI: Patient is 80 year old Caucasian female was evaluated for melena and iron deficiency anemia back in August 2014. EGD revealed mild changes of reflux esophagitis small sliding heart hernia and antral scar with focal gastritis in 5 mm pyloric channel ulcer. H. pylori serology was negative. Colonoscopy was incomplete the hepatic flexure revealed large rectal polyp which was removed piecemeal and residual polyp was ablated with APC. Histology revealed tubulovillous adenoma with high-grade dysplasia. Patient has been sw.itched to aspirin from Xarelto. She has not experienced any more episodes of melena. She also denies rectal bleeding or abdominal pain. She is undergoing flexible sigmoidoscopy to treat residual polyp.  Past Medical History  Diagnosis Date  . Hypertension   . Atrial fibrillation     digoxin, coumadin  . Mild dementia   . Mitral insufficiency   . Tricuspid insufficiency   . Moderate to severe pulmonary hypertension     moderate  . Chronic venous insufficiency   . History of nuclear stress test 12/12/05/2010    lexiscan; non-diagnostic for ischemia, low risk   . Depression     Past Surgical History  Procedure Laterality Date  . Cholecystectomy  2006  . Total knee arthroplasty Left 2013  . Tonsillectomy    . Colonoscopy with esophagogastroduodenoscopy (egd) N/A 06/22/2013    Procedure: COLONOSCOPY WITH ESOPHAGOGASTRODUODENOSCOPY (EGD);  Surgeon: Rogene Houston, MD;  Location: [facility];  Service: Endoscopy;  Laterality: N/A;  730  . Transthoracic echocardiogram  10/24/2012    EF=50-55%, mod conc LVH; RV mod-severely dilated & systolic function mildly reduced; LA severely dialted, possible small PFO; mild mitral annular calcif & mild MR; mod TR & elevated RV systolic pressure, mod pulm HTN; AV mildyl sclerotic & mild-mod regurg (performed for murmur & afib)  . Total knee  arthroplasty Right 12/07/2013    Family History  Problem Relation Age of Onset  . Colon cancer Neg Hx    Social History:  reports that she has never smoked. She has never used smokeless tobacco. She reports that she does not drink alcohol or use illicit drugs.  Allergies: No Known Allergies  Medications Prior to Admission  Medication Sig Dispense Refill  . aspirin 325 MG EC tablet Take 325 mg by mouth daily.      Marland Kitchen atenolol (TENORMIN) 50 MG tablet Take 50 mg by mouth daily.      . digoxin (LANOXIN) 0.125 MG tablet Take 1 tablet (0.125 mg total) by mouth daily.  30 tablet  6  . donepezil (ARICEPT) 10 MG tablet Take 10 mg by mouth at bedtime.      . DULoxetine (CYMBALTA) 30 MG capsule Take 30 mg by mouth daily.      . fluticasone (FLONASE) 50 MCG/ACT nasal spray Place 2 sprays into the nose daily as needed for allergies.       . pantoprazole (PROTONIX) 40 MG tablet Take 1 tablet (40 mg total) by mouth daily.  60 tablet  5  . peg 3350 powder (MOVIPREP) 100 G SOLR Take 1 kit (200 g total) by mouth once.  1 kit  0    No results found for this or any previous visit (from the past 48 hour(s)). No results found.  ROS  Blood pressure 125/79, temperature 97.4 F (36.3 C), temperature source Oral, resp. rate 22, height _0  (1.626 m), weight 145 lb (65.772 kg), SpO2 93.00%. Physical Exam  Constitutional: She  appears well-developed and well-nourished.  HENT:  Mouth/Throat: Oropharynx is clear and moist.  Eyes: Conjunctivae are normal. No scleral icterus.  Neck: No thyromegaly present.  Cardiovascular:  Irregular rhythm normal S1 and S2. Grade 2/6 systolic ejection murmur best heard at LLSB  Respiratory: Effort normal and breath sounds normal.  GI: Soft. She exhibits no distension and no mass. There is no tenderness.  Musculoskeletal: She exhibits no edema.  Lymphadenopathy:    She has no cervical adenopathy.  Neurological: She is alert.  Skin: Skin is warm and dry.      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 Flexible sigmoidoscopy with removal of residual polyp.  Rogene Houston 03/14/2014, 7:35 AM

## 2014-03-14 NOTE — Op Note (Signed)
FLEXIBLE SIGMOIDOSCOPY  PROCEDURE REPORT  PATIENT:  Tamara Mitchell  MR#:  [id number] Birthdate:  07/21/1934, 80 y.o., female Endoscopist:  Dr. Rogene Houston, MD Referred By:  Dr. Delphina Cahill, MD Procedure Date: 03/14/2014  Procedure:   Flexible sigmoidoscopy.  Indications:  Patient is a 80 year old Caucasian female was evaluated in August 2014 for melena and iron deficiency anemia. She underwent EGD and incomplete colonoscopy. She was found to have complex rectal polyp which was removed piecemeal. Biopsy revealed tubulovillous adenoma with high-grade dysplasia but no evidence of invasion. Patient was advised repeat flexible sigmoidoscopy in 3 months to reassess the site and to remove any residual polyp. She is returning 9 months later and has no symptoms. She is now on aspirin instead of Xarelto.  Informed Consent:  The procedure and risks were reviewed with the patient and informed consent was obtained.  Medications:  Demerol 25 mg IV Versed 3 mg IV  Description of procedure:  After a digital rectal exam was performed, that colonoscope was advanced from the anus through the rectum and advanced into the distal sigmoid colon. Scope was passed to 30 cm from the anal margin and gradually withdrawn and findings noted. Scope was also retroflexed in the rectum.  Findings:   Prep satisfactory. Normal mucosa of distal colon. Distal rectal lesion with inverted margins and central ulceration next to dentate line. Endoscopic appearance consistent with carcinoma. Multiple biopsies taken. This lesion is easily palpable on digital exam revealing indurated raised margins and central ulceration.   Therapeutic/Diagnostic Maneuvers Performed:  See above  Complications:  None  Impression:  Ulcerated distal rectal lesion with central depression at site of previous polypectomy. Digital and endoscopic appearance consistent with carcinoma. Multiple biopsies taken.   Recommendations:  Standard instructions  given. Chest and abdominopelvic CT with contrast. Further recommendations to follow.  Rogene Houston  03/14/2014 8:05 AM  CC: Dr. Delphina Cahill, MD & Dr. Rayne Du ref. provider found

## 2014-03-14 NOTE — OR Nursing (Signed)
Pt transported to [place], [place] and [facility]

## 2014-03-14 NOTE — OR Nursing (Signed)
Family sitting with pt

## 2014-03-15 ENCOUNTER — Encounter (HOSPITAL_COMMUNITY): Payer: Self-pay | Admitting: Internal Medicine

## 2014-03-17 ENCOUNTER — Telehealth: Payer: Self-pay | Admitting: Internal Medicine

## 2014-03-17 NOTE — Telephone Encounter (Signed)
Pt informed to stay on 325 mg asa until we find out about surgery then after the surgery we can look into another med for her a fibb, pt. Stated  Understanding of instructions

## 2014-03-17 NOTE — Telephone Encounter (Signed)
Please call,wants to talk to you about the aspirin she is on now 325 mg.

## 2014-04-01 ENCOUNTER — Telehealth: Payer: Self-pay | Admitting: Internal Medicine

## 2014-04-01 NOTE — Telephone Encounter (Signed)
Pt says she feel real weak all the time and real short of breath.

## 2014-04-01 NOTE — Telephone Encounter (Signed)
Pt. Stated she was tired and sob all the starting about two weeks ago, I encouraged the pt. To call her PCP on Monday and if Dr. Nevada Crane thinks we need to see her make an appt to see Dr. Debara Pickett

## 2014-04-03 ENCOUNTER — Encounter (HOSPITAL_COMMUNITY): Payer: Self-pay | Admitting: Emergency Medicine

## 2014-04-03 ENCOUNTER — Emergency Department (HOSPITAL_COMMUNITY): Payer: Medicare Other

## 2014-04-03 ENCOUNTER — Inpatient Hospital Stay (HOSPITAL_COMMUNITY)
Admission: EM | Admit: 2014-04-03 | Discharge: 2014-04-07 | DRG: 292 | Disposition: A | Discharge status: Home Health | Payer: Medicare Other | Attending: Family Medicine | Admitting: Family Medicine

## 2014-04-03 DIAGNOSIS — F0390 Unspecified dementia without behavioral disturbance: Secondary | ICD-10-CM

## 2014-04-03 DIAGNOSIS — R509 Fever, unspecified: Secondary | ICD-10-CM

## 2014-04-03 DIAGNOSIS — Z7901 Long term (current) use of anticoagulants: Secondary | ICD-10-CM

## 2014-04-03 DIAGNOSIS — R112 Nausea with vomiting, unspecified: Secondary | ICD-10-CM

## 2014-04-03 DIAGNOSIS — K921 Melena: Secondary | ICD-10-CM

## 2014-04-03 DIAGNOSIS — I4891 Unspecified atrial fibrillation: Secondary | ICD-10-CM | POA: Diagnosis present

## 2014-04-03 DIAGNOSIS — Z9089 Acquired absence of other organs: Secondary | ICD-10-CM

## 2014-04-03 DIAGNOSIS — R32 Unspecified urinary incontinence: Secondary | ICD-10-CM

## 2014-04-03 DIAGNOSIS — I2781 Cor pulmonale (chronic): Secondary | ICD-10-CM

## 2014-04-03 DIAGNOSIS — I83893 Varicose veins of bilateral lower extremities with other complications: Secondary | ICD-10-CM

## 2014-04-03 DIAGNOSIS — I5033 Acute on chronic diastolic (congestive) heart failure: Principal | ICD-10-CM

## 2014-04-03 DIAGNOSIS — D62 Acute posthemorrhagic anemia: Secondary | ICD-10-CM

## 2014-04-03 DIAGNOSIS — F32A Depression, unspecified: Secondary | ICD-10-CM

## 2014-04-03 DIAGNOSIS — Z8673 Personal history of transient ischemic attack (TIA), and cerebral infarction without residual deficits: Secondary | ICD-10-CM

## 2014-04-03 DIAGNOSIS — I83029 Varicose veins of left lower extremity with ulcer of unspecified site: Secondary | ICD-10-CM

## 2014-04-03 DIAGNOSIS — F329 Major depressive disorder, single episode, unspecified: Secondary | ICD-10-CM

## 2014-04-03 DIAGNOSIS — K259 Gastric ulcer, unspecified as acute or chronic, without hemorrhage or perforation: Secondary | ICD-10-CM

## 2014-04-03 DIAGNOSIS — I509 Heart failure, unspecified: Secondary | ICD-10-CM | POA: Diagnosis present

## 2014-04-03 DIAGNOSIS — Z7982 Long term (current) use of aspirin: Secondary | ICD-10-CM

## 2014-04-03 DIAGNOSIS — L97929 Non-pressure chronic ulcer of unspecified part of left lower leg with unspecified severity: Secondary | ICD-10-CM

## 2014-04-03 DIAGNOSIS — F3289 Other specified depressive episodes: Secondary | ICD-10-CM | POA: Diagnosis present

## 2014-04-03 DIAGNOSIS — E876 Hypokalemia: Secondary | ICD-10-CM

## 2014-04-03 DIAGNOSIS — I10 Essential (primary) hypertension: Secondary | ICD-10-CM

## 2014-04-03 DIAGNOSIS — R0609 Other forms of dyspnea: Secondary | ICD-10-CM

## 2014-04-03 DIAGNOSIS — Z96659 Presence of unspecified artificial knee joint: Secondary | ICD-10-CM

## 2014-04-03 DIAGNOSIS — K922 Gastrointestinal hemorrhage, unspecified: Secondary | ICD-10-CM

## 2014-04-03 DIAGNOSIS — I2789 Other specified pulmonary heart diseases: Secondary | ICD-10-CM | POA: Diagnosis present

## 2014-04-03 DIAGNOSIS — R21 Rash and other nonspecific skin eruption: Secondary | ICD-10-CM

## 2014-04-03 DIAGNOSIS — Z86010 Personal history of colonic polyps: Secondary | ICD-10-CM

## 2014-04-03 DIAGNOSIS — Z800 Family history of malignant neoplasm of digestive organs: Secondary | ICD-10-CM

## 2014-04-03 DIAGNOSIS — C20 Malignant neoplasm of rectum: Secondary | ICD-10-CM | POA: Diagnosis present

## 2014-04-03 DIAGNOSIS — K219 Gastro-esophageal reflux disease without esophagitis: Secondary | ICD-10-CM | POA: Diagnosis present

## 2014-04-03 DIAGNOSIS — I5032 Chronic diastolic (congestive) heart failure: Secondary | ICD-10-CM

## 2014-04-03 HISTORY — DX: Essential (primary) hypertension: I10

## 2014-04-03 HISTORY — DX: Gastrointestinal hemorrhage, unspecified: K92.2

## 2014-04-03 HISTORY — DX: Permanent atrial fibrillation: I48.21

## 2014-04-03 HISTORY — DX: Rectal polyp: K62.1

## 2014-04-03 HISTORY — DX: Pulmonary hypertension, unspecified: I27.20

## 2014-04-03 LAB — COMPREHENSIVE METABOLIC PANEL
ALK PHOS: 97 U/L (ref 39–117)
ALT: 9 U/L (ref 0–35)
AST: 18 U/L (ref 0–37)
Albumin: 3.2 g/dL — ABNORMAL LOW (ref 3.5–5.2)
BUN: 26 mg/dL — AB (ref 6–23)
CHLORIDE: 100 meq/L (ref 96–112)
CO2: 27 meq/L (ref 19–32)
Calcium: 9.5 mg/dL (ref 8.4–10.5)
Creatinine, Ser: 0.7 mg/dL (ref 0.50–1.10)
GFR, EST NON AFRICAN AMERICAN: 81 mL/min — AB (ref >90)
GLUCOSE: 95 mg/dL (ref 70–99)
POTASSIUM: 4.1 meq/L (ref 3.7–5.3)
Sodium: 141 mEq/L (ref 137–147)
Total Bilirubin: 1.9 mg/dL — ABNORMAL HIGH (ref 0.3–1.2)
Total Protein: 7 g/dL (ref 6.0–8.3)

## 2014-04-03 LAB — CBC WITH DIFFERENTIAL/PLATELET
Basophils Absolute: 0 10*3/uL (ref 0.0–0.1)
Basophils Relative: 1 % (ref 0–1)
Eosinophils Absolute: 0.1 10*3/uL (ref 0.0–0.7)
Eosinophils Relative: 2 % (ref 0–5)
HEMATOCRIT: 40.6 % (ref 36.0–46.0)
HEMOGLOBIN: 12.7 g/dL (ref 12.0–15.0)
LYMPHS ABS: 1.2 10*3/uL (ref 0.7–4.0)
Lymphocytes Relative: 21 % (ref 12–46)
MCH: 28.9 pg (ref 26.0–34.0)
MCHC: 31.3 g/dL (ref 30.0–36.0)
MCV: 92.3 fL (ref 78.0–100.0)
MONO ABS: 0.3 10*3/uL (ref 0.1–1.0)
MONOS PCT: 6 % (ref 3–12)
NEUTROS ABS: 4 10*3/uL (ref 1.7–7.7)
Neutrophils Relative %: 70 % (ref 43–77)
Platelets: 202 10*3/uL (ref 150–400)
RBC: 4.4 MIL/uL (ref 3.87–5.11)
RDW: 19.1 % — ABNORMAL HIGH (ref 11.5–15.5)
WBC: 5.6 10*3/uL (ref 4.0–10.5)

## 2014-04-03 LAB — PRO B NATRIURETIC PEPTIDE: Pro B Natriuretic peptide (BNP): 7099 pg/mL — ABNORMAL HIGH (ref 0–450)

## 2014-04-03 LAB — TROPONIN I: Troponin I: <0.3 ng/mL (ref <0.30)

## 2014-04-03 LAB — DIGOXIN LEVEL: Digoxin Level: 0.6 ng/mL — ABNORMAL LOW (ref 0.8–2.0)

## 2014-04-03 MED ORDER — ATENOLOL 25 MG PO TABS
50.0000 mg | ORAL_TABLET | Freq: Every day | ORAL | Status: DC
  Administered 2014-04-03 – 2014-04-07 (×5): 50 mg via ORAL
  Filled 2014-04-03 (×5): qty 2

## 2014-04-03 MED ORDER — FLUTICASONE PROPIONATE 50 MCG/ACT NA SUSP: 2.0000 | Freq: Every day | NASAL | Status: DC | PRN

## 2014-04-03 MED ORDER — IOHEXOL 350 MG/ML SOLN
100.0000 mL | Freq: Once | INTRAVENOUS | Status: AC | PRN
  Administered 2014-04-03: 100 mL via INTRAVENOUS

## 2014-04-03 MED ORDER — NITROGLYCERIN 2 % TD OINT
1.0000 [in_us] | TOPICAL_OINTMENT | Freq: Once | TRANSDERMAL | Status: AC
  Administered 2014-04-03: 1 [in_us] via TOPICAL
  Filled 2014-04-03: qty 1

## 2014-04-03 MED ORDER — HEPARIN SODIUM (PORCINE) 5000 UNIT/ML IJ SOLN
5000.0000 [IU] | Freq: Three times a day (TID) | INTRAMUSCULAR | Status: DC
  Administered 2014-04-03 – 2014-04-07 (×10): 5000 [IU] via SUBCUTANEOUS
  Filled 2014-04-03 (×11): qty 1

## 2014-04-03 MED ORDER — HYDROCODONE-ACETAMINOPHEN 5-325 MG PO TABS: 1.0000 | ORAL_TABLET | ORAL | Status: DC | PRN

## 2014-04-03 MED ORDER — ACETAMINOPHEN 650 MG RE SUPP: 650.0000 mg | Freq: Four times a day (QID) | RECTAL | Status: DC | PRN

## 2014-04-03 MED ORDER — ALUM & MAG HYDROXIDE-SIMETH 200-200-20 MG/5ML PO SUSP: 30.0000 mL | Freq: Four times a day (QID) | ORAL | Status: DC | PRN

## 2014-04-03 MED ORDER — DULOXETINE HCL 30 MG PO CPEP
30.0000 mg | ORAL_CAPSULE | Freq: Every day | ORAL | Status: DC
  Administered 2014-04-03 – 2014-04-07 (×5): 30 mg via ORAL
  Filled 2014-04-03 (×5): qty 1

## 2014-04-03 MED ORDER — SODIUM CHLORIDE 0.9 % IJ SOLN: 3.0000 mL | INTRAMUSCULAR | Status: DC | PRN

## 2014-04-03 MED ORDER — DOCUSATE SODIUM 100 MG PO CAPS
100.0000 mg | ORAL_CAPSULE | Freq: Two times a day (BID) | ORAL | Status: DC
Start: 2014-04-03 — End: 2014-04-07
  Administered 2014-04-03 – 2014-04-07 (×8): 100 mg via ORAL
  Filled 2014-04-03 (×8): qty 1

## 2014-04-03 MED ORDER — DIGOXIN 125 MCG PO TABS
0.1250 mg | ORAL_TABLET | Freq: Every day | ORAL | Status: DC
  Administered 2014-04-03 – 2014-04-07 (×5): 0.125 mg via ORAL
  Filled 2014-04-03 (×5): qty 1

## 2014-04-03 MED ORDER — VITAMIN B-1 100 MG PO TABS
100.0000 mg | ORAL_TABLET | Freq: Every day | ORAL | Status: DC
  Administered 2014-04-03 – 2014-04-07 (×5): 100 mg via ORAL
  Filled 2014-04-03 (×4): qty 1

## 2014-04-03 MED ORDER — FUROSEMIDE 10 MG/ML IJ SOLN
60.0000 mg | Freq: Once | INTRAMUSCULAR | Status: AC
  Administered 2014-04-03: 60 mg via INTRAVENOUS
  Filled 2014-04-03: qty 6

## 2014-04-03 MED ORDER — FUROSEMIDE 10 MG/ML IJ SOLN
40.0000 mg | Freq: Every day | INTRAMUSCULAR | Status: DC
  Administered 2014-04-03 – 2014-04-06 (×4): 40 mg via INTRAVENOUS
  Filled 2014-04-03 (×4): qty 4

## 2014-04-03 MED ORDER — BISACODYL 10 MG RE SUPP: 10.0000 mg | Freq: Every day | RECTAL | Status: DC | PRN

## 2014-04-03 MED ORDER — DONEPEZIL HCL 5 MG PO TABS
10.0000 mg | ORAL_TABLET | Freq: Every day | ORAL | Status: DC
  Administered 2014-04-03 – 2014-04-06 (×4): 10 mg via ORAL
  Filled 2014-04-03 (×4): qty 2

## 2014-04-03 MED ORDER — SODIUM CHLORIDE 0.9 % IJ SOLN
3.0000 mL | Freq: Two times a day (BID) | INTRAMUSCULAR | Status: DC
  Administered 2014-04-03 – 2014-04-06 (×4): 3 mL via INTRAVENOUS

## 2014-04-03 MED ORDER — ASPIRIN EC 325 MG PO TBEC: 325.0000 mg | DELAYED_RELEASE_TABLET | Freq: Every day | ORAL | Status: DC

## 2014-04-03 MED ORDER — PANTOPRAZOLE SODIUM 40 MG PO TBEC
40.0000 mg | DELAYED_RELEASE_TABLET | Freq: Every day | ORAL | Status: DC
  Administered 2014-04-03 – 2014-04-07 (×5): 40 mg via ORAL
  Filled 2014-04-03 (×5): qty 1

## 2014-04-03 MED ORDER — ACETAMINOPHEN 325 MG PO TABS
650.0000 mg | ORAL_TABLET | Freq: Four times a day (QID) | ORAL | Status: DC | PRN
  Administered 2014-04-06: 650 mg via ORAL
  Filled 2014-04-03: qty 2

## 2014-04-03 MED ORDER — ONDANSETRON HCL 4 MG PO TABS: 4.0000 mg | ORAL_TABLET | Freq: Four times a day (QID) | ORAL | Status: DC | PRN

## 2014-04-03 MED ORDER — ONDANSETRON HCL 4 MG/2ML IJ SOLN: 4.0000 mg | Freq: Four times a day (QID) | INTRAMUSCULAR | Status: DC | PRN

## 2014-04-03 MED ORDER — SODIUM CHLORIDE 0.9 % IJ SOLN
INTRAMUSCULAR | Status: AC
  Filled 2014-04-03: qty 250

## 2014-04-03 MED ORDER — SODIUM CHLORIDE 0.9 % IV SOLN: 250.0000 mL | INTRAVENOUS | Status: DC | PRN

## 2014-04-03 MED ORDER — ASPIRIN EC 325 MG PO TBEC
325.0000 mg | DELAYED_RELEASE_TABLET | Freq: Every day | ORAL | Status: DC
  Administered 2014-04-03 – 2014-04-07 (×5): 325 mg via ORAL
  Filled 2014-04-03 (×5): qty 1

## 2014-04-03 MED ORDER — SODIUM CHLORIDE 0.9 % IJ SOLN
3.0000 mL | Freq: Two times a day (BID) | INTRAMUSCULAR | Status: DC
Start: 2014-04-03 — End: 2014-04-07
  Administered 2014-04-03 – 2014-04-06 (×6): 3 mL via INTRAVENOUS

## 2014-04-03 MED ORDER — ADULT MULTIVITAMIN W/MINERALS CH
1.0000 | ORAL_TABLET | Freq: Every day | ORAL | Status: DC
  Administered 2014-04-03 – 2014-04-07 (×5): 1 via ORAL
  Filled 2014-04-03 (×5): qty 1

## 2014-04-03 MED ORDER — FOLIC ACID 1 MG PO TABS
1.0000 mg | ORAL_TABLET | Freq: Every day | ORAL | Status: DC
  Administered 2014-04-03 – 2014-04-07 (×5): 1 mg via ORAL
  Filled 2014-04-03 (×5): qty 1

## 2014-04-03 MED ORDER — NITROGLYCERIN 2 % TD OINT
1.0000 [in_us] | TOPICAL_OINTMENT | Freq: Three times a day (TID) | TRANSDERMAL | Status: DC
  Administered 2014-04-03 – 2014-04-04 (×2): 1 [in_us] via TOPICAL
  Filled 2014-04-03 (×2): qty 1

## 2014-04-03 NOTE — H&P (Signed)
[facility] History and Physical  Tamara Mitchell DJS:[id number] DOB: [birth year] DOA: 04/03/2014  Referring physician: Rolland Porter, MD PCP: Delphina Cahill, MD   Chief Complaint: Shortness of breath  HPI: Tamara Mitchell is a 80 y.o. female presents with shortness of breath. Patient states that she has been feeling this way for a week or so. She states that she has had no cough and no congestion. She has not had any chest pain. She has had edema of her legs however. She states there is some wheeze noted also. She has no syncope. She denies having any headaches. She states that she recently was diagnosed with rectal cancer. She had been stopped on her anticoagulation due to bleeding and now only takes an aspirin every day. She has had no fevers no hemoptysis. She has no abdominal pain noted. She does have a history of an irregular heart beat and has been on digoxin for this.   Review of Systems:  Constitutional:  No weight loss, night sweats, Fevers, chills, fatigue.  HEENT:  No headaches, Difficulty swallowing Cardio-vascular:  No chest pain, Orthopnea, PND, ++swelling in lower extremities, no palpitations  GI:  No heartburn, indigestion, abdominal pain, nausea, vomiting, diarrhea  Resp:  No shortness of breath with exertion or at rest. No excess mucus, no productive cough, No non-productive cough  Skin:  no rash or lesions.  GU:  no dysuria, change in color of urine Musculoskeletal:  No joint pain or swelling. No decreased range of motion ++ankle edema Psych:  No change in mood or affect. No depression or anxiety. No memory loss.   Past Medical History  Diagnosis Date  . Hypertension   . Atrial fibrillation     digoxin, coumadin  . Mild dementia   . Mitral insufficiency   . Tricuspid insufficiency   . Moderate to severe pulmonary hypertension     moderate  . Chronic venous insufficiency   . History of nuclear stress test 12/12/05/2010    lexiscan; non-diagnostic for ischemia, low  risk   . Depression    Past Surgical History  Procedure Laterality Date  . Cholecystectomy  2006  . Total knee arthroplasty Left 2013  . Tonsillectomy    . Colonoscopy with esophagogastroduodenoscopy (egd) N/A 06/22/2013    Procedure: COLONOSCOPY WITH ESOPHAGOGASTRODUODENOSCOPY (EGD);  Surgeon: Rogene Houston, MD;  Location: [facility];  Service: Endoscopy;  Laterality: N/A;  730  . Transthoracic echocardiogram  10/24/2012    EF=50-55%, mod conc LVH; RV mod-severely dilated & systolic function mildly reduced; LA severely dialted, possible small PFO; mild mitral annular calcif & mild MR; mod TR & elevated RV systolic pressure, mod pulm HTN; AV mildyl sclerotic & mild-mod regurg (performed for murmur & afib)  . Total knee arthroplasty Right 12/07/2013  . Flexible sigmoidoscopy N/A 03/14/2014    Procedure: FLEXIBLE SIGMOIDOSCOPY;  Surgeon: Rogene Houston, MD;  Location: [facility];  Service: Endoscopy;  Laterality: N/A;  730  . Polypectomy N/A 03/14/2014    Procedure: POLYPECTOMY;  Surgeon: Rogene Houston, MD;  Location: [facility];  Service: Endoscopy;  Laterality: N/A;   Social History:  reports that she has never smoked. She has never used smokeless tobacco. She reports that she does not drink alcohol or use illicit drugs.  No Known Allergies  Family History  Problem Relation Age of Onset  . Colon cancer Neg Hx      Prior to Admission medications   Medication Sig Start Date End Date Taking?  Authorizing Provider  aspirin 325 MG EC tablet Take 325 mg by mouth daily.   Yes Historical Provider, MD  atenolol (TENORMIN) 50 MG tablet Take 50 mg by mouth daily.   Yes Historical Provider, MD  digoxin (LANOXIN) 0.125 MG tablet Take 1 tablet (0.125 mg total) by mouth daily. 01/07/14  Yes Pixie Casino, MD  donepezil (ARICEPT) 10 MG tablet Take 10 mg by mouth at bedtime.   Yes Historical Provider, MD  DULoxetine (CYMBALTA) 30 MG capsule Take 30 mg by mouth daily.   Yes Historical  Provider, MD  fluticasone (FLONASE) 50 MCG/ACT nasal spray Place 2 sprays into the nose daily as needed for allergies.    Yes Historical Provider, MD  pantoprazole (PROTONIX) 40 MG tablet Take 1 tablet (40 mg total) by mouth daily. 06/22/13  Yes Rogene Houston, MD  promethazine (PHENERGAN) 12.5 MG tablet Take 12.5 mg by mouth every 4 (four) hours as needed for nausea or vomiting.   Yes Historical Provider, MD   Physical Exam: Filed Vitals:   04/03/14 1612  BP: 155/97  Pulse: 84  Temp: 97.6 F (36.4 C)  Resp: 16    BP 155/97  Pulse 84  Temp(Src) 97.6 F (36.4 C) (Oral)  Resp 16  Ht 5\' 4"  (1.626 m)  Wt 66.679 kg (147 lb)  BMI 25.22 kg/m2  SpO2 90%  General:  Appears calm and comfortable Eyes: PERRL, normal lids, irises & conjunctiva ENT: grossly normal hearing, lips & tongue Neck: no LAD, masses or thyromegaly Cardiovascular: IRR, +murmur +gallop. ++LE edema.  Respiratory: Normal respiratory effort. Few scattered ronchi Abdomen: soft, ntnd Skin: no rash or induration seen on limited exam Musculoskeletal: grossly normal tone BUE/BLE ++edema Psychiatric: grossly normal mood and affect, speech fluent and appropriate Neurologic: grossly non-focal.          Labs on Admission:  Basic Metabolic Panel:  Recent Labs Lab 04/03/14 1524  NA 141  K 4.1  CL 100  CO2 27  GLUCOSE 95  BUN 26*  CREATININE 0.70  CALCIUM 9.5   Liver Function Tests:  Recent Labs Lab 04/03/14 1524  AST 18  ALT 9  ALKPHOS 97  BILITOT 1.9*  PROT 7.0  ALBUMIN 3.2*   No results found for this basename: LIPASE, AMYLASE,  in the last 168 hours No results found for this basename: AMMONIA,  in the last 168 hours CBC:  Recent Labs Lab 04/03/14 1524  WBC 5.6  NEUTROABS 4.0  HGB 12.7  HCT 40.6  MCV 92.3  PLT 202   Cardiac Enzymes:  Recent Labs Lab 04/03/14 1524  TROPONINI <0.30    BNP (last 3 results)  Recent Labs  04/03/14 1524  PROBNP 7099.0*   CBG: No results found for  this basename: GLUCAP,  in the last 168 hours  Radiological Exams on Admission: Dg Chest 2 View  04/03/2014   CLINICAL DATA:  Shortness of breath.  EXAM: CHEST  2 VIEW  COMPARISON:  01/04/2014 and 12/24/2013  FINDINGS: Lungs are adequately inflated without focal consolidation or effusion. There is minimal the right per mediastinal density unchanged likely due in part to patient's rotation towards the right. There is moderate to severe stable cardiomegaly. Remainder the exam is unchanged.  IMPRESSION: Moderate to severe stable cardiomegaly. No acute cardiopulmonary disease.   Electronically Signed   By: Marin Olp M.D.   On: 04/03/2014 15:57   Ct Angio Chest W/cm &/or Wo Cm  04/03/2014   CLINICAL DATA:  Shortness of breath.  Atrial fibrillation. Recently diagnosed with colon carcinoma and taken off anticoagulation. Clinical suspicion for pulmonary embolism.  EXAM: CT ANGIOGRAPHY CHEST WITH CONTRAST  TECHNIQUE: Multidetector CT imaging of the chest was performed using the standard protocol during bolus administration of intravenous contrast. Multiplanar CT image reconstructions and MIPs were obtained to evaluate the vascular anatomy.  CONTRAST:  166mL OMNIPAQUE IOHEXOL 350 MG/ML SOLN  COMPARISON:  03/14/2014  FINDINGS: Satisfactory opacification of pulmonary arteries noted, and no pulmonary emboli identified. No evidence of thoracic aortic dissection or aneurysm. No evidence of mediastinal hematoma or mass.  Marked cardiomegaly is stable and enlarged central pulmonary arteries remain suspicious for pulmonary arterial hypertension. Reflux of contrast into the IVC and hepatic veins is consistent with right heart insufficiency.  New small right pleural effusion is seen. Mild diffuse granular pulmonary opacity and interstitial prominence is suspicious for mild interstitial edema. No evidence of pulmonary consolidation. Scattered tiny sub-cm bilateral pulmonary nodules remain stable. Early pulmonary metastases  cannot definitely be excluded in the setting of colon cancer.  Review of the MIP images confirms the above findings.  IMPRESSION: No evidence of pulmonary embolism.  New small right pleural effusion and probable mild interstitial edema, suspicious for mild congestive heart failure.  Stable cardiomegaly and enlarged central pulmonary arteries, highly suspicious for pulmonary arterial hypertension. Also demonstrated is reflux of contrast into the IVC and hepatic veins, consistent with right heart insufficiency.  Stable tiny bilateral sub-cm pulmonary nodules. Early pulmonary metastases cannot be excluded in the setting of colon cancer. Continued followup by chest CT recommended in 3-4 months.   Electronically Signed   By: Earle Gell M.D.   On: 04/03/2014 17:18    EKG: Independently reviewed. Atrial fibrillation  Assessment/Plan Active Problems:   CHF (congestive heart failure)   1. CHF -she presents with shortness of breath has mild CHF and an elevated BNP -will admit to telemetry -start on IV lasix -will get an echo in the am -check serial enzymes  2. Atrial Fibrillation -appears to be rate controlled -continue digoxin -not on coumadin or xaralto due to bleeding -continue with aspirin  3. Rectal cancer -recent diagnosis -patient states workup done in [facility] -she states she is to follow up with oncology in [place]  4. Mild Dementia -currently stable    Code Status: Full code (must indicate code status--if unknown or must be presumed, indicate so) Family Communication: Husband in room (indicate person spoken with, if applicable, with phone number if by telephone) Disposition Plan: Home (indicate anticipated LOS)  Time spent: 12min  Garin Mata A Aydan [place] [facility] Pager [phone]  **Disclaimer: This note may have been dictated with voice recognition software. Similar sounding words can inadvertently be transcribed and this note may contain transcription errors which may not have  been corrected upon publication of note.**

## 2014-04-03 NOTE — ED Notes (Signed)
Recently diagnosed with colon cancer

## 2014-04-03 NOTE — ED Notes (Signed)
Short of breath for the past 2 days

## 2014-04-03 NOTE — ED Provider Notes (Signed)
CSN: [id number]     Arrival date & time 04/03/14  1344 History  This chart was scribed for Janice Norrie, MD by Steva Colder, ED Scribe. The patient was seen in room [facility]10/[facility]10 at 3:10 PM     Chief Complaint  Patient presents with  . Shortness of Breath     The history is provided by the patient. No language interpreter was used.    HPI Comments: Tamara Mitchell is a 80 y.o. female with h/o A-fib, mitral insufficiency, chronic venous insufficiency, moderate-to-severe pulmonary hypertension, and recent diagnosis of colon cancer who presents to the Emergency Department complaining of SOB that began several weeks ago.  Pt states she has trouble catching her breath when walking even short distances across her house.  This has been ongoing for several weeks.  She has had a dry cough without fever. She was recently diagnosed with colon cancer and states "things are changing, I guess the diagnosis has made me have anxiety over that."  She notes some mild cough and nausea over the past few days.  She denies vomiting.  She has been taking Phenergan for her nausea.  She also reports leg swelling which has been ongoing for the past 3-4 days.  She denies fever, CP, or palpitations.  Pt denies prior h/o SOB.  She does have h/o A-fib but states her current symptoms do not feel similar; however she also states "I feel like this may be due to A-fib, I feel like the medication's not working for that."  Pt was taken off of blood-thinners recently (was on Coumadin, and then xarelto) due to a bleed. She is currently on ASA 325 mg daily.  Pt lives at home.  A family member spends the night but she otherwise lives alone.  She does not smoke or drink.  PCP Dr Merlyn Albert Cardiology Dr Debara Pickett GI Dr Laural Golden  Past Medical History  Diagnosis Date  . Hypertension   . Atrial fibrillation     digoxin, coumadin  . Mild dementia   . Mitral insufficiency   . Tricuspid insufficiency   . Moderate to severe pulmonary hypertension      moderate  . Chronic venous insufficiency   . History of nuclear stress test 12/12/05/2010    lexiscan; non-diagnostic for ischemia, low risk   . Depression     Past Surgical History  Procedure Laterality Date  . Cholecystectomy  2006  . Total knee arthroplasty Left 2013  . Tonsillectomy    . Colonoscopy with esophagogastroduodenoscopy (egd) N/A 06/22/2013    Procedure: COLONOSCOPY WITH ESOPHAGOGASTRODUODENOSCOPY (EGD);  Surgeon: Rogene Houston, MD;  Location: [facility];  Service: Endoscopy;  Laterality: N/A;  730  . Transthoracic echocardiogram  10/24/2012    EF=50-55%, mod conc LVH; RV mod-severely dilated & systolic function mildly reduced; LA severely dialted, possible small PFO; mild mitral annular calcif & mild MR; mod TR & elevated RV systolic pressure, mod pulm HTN; AV mildyl sclerotic & mild-mod regurg (performed for murmur & afib)  . Total knee arthroplasty Right 12/07/2013  . Flexible sigmoidoscopy N/A 03/14/2014    Procedure: FLEXIBLE SIGMOIDOSCOPY;  Surgeon: Rogene Houston, MD;  Location: [facility];  Service: Endoscopy;  Laterality: N/A;  730  . Polypectomy N/A 03/14/2014    Procedure: POLYPECTOMY;  Surgeon: Rogene Houston, MD;  Location: [facility];  Service: Endoscopy;  Laterality: N/A;    Family History  Problem Relation Age of Onset  . Colon cancer Neg Hx  History  Substance Use Topics  . Smoking status: Never Smoker   . Smokeless tobacco: Never Used  . Alcohol Use: No  lives at home Brother stays with her at night  OB History   Grav Para Term Preterm Abortions TAB SAB Ect Mult Living                   Review of Systems  Respiratory: Positive for cough (minimal) and shortness of breath.   Cardiovascular: Positive for leg swelling.  Gastrointestinal: Positive for nausea. Negative for vomiting.  Psychiatric/Behavioral: The patient is nervous/anxious.   All other systems reviewed and are negative.     Allergies  Review of patient's  allergies indicates no known allergies.  Home Medications   Prior to Admission medications   Medication Sig Start Date End Date Taking? Authorizing Provider  aspirin 325 MG EC tablet Take 325 mg by mouth daily.   Yes Historical Provider, MD  atenolol (TENORMIN) 50 MG tablet Take 50 mg by mouth daily.   Yes Historical Provider, MD  digoxin (LANOXIN) 0.125 MG tablet Take 1 tablet (0.125 mg total) by mouth daily. 01/07/14  Yes Pixie Casino, MD  donepezil (ARICEPT) 10 MG tablet Take 10 mg by mouth at bedtime.   Yes Historical Provider, MD  DULoxetine (CYMBALTA) 30 MG capsule Take 30 mg by mouth daily.   Yes Historical Provider, MD  fluticasone (FLONASE) 50 MCG/ACT nasal spray Place 2 sprays into the nose daily as needed for allergies.    Yes Historical Provider, MD  pantoprazole (PROTONIX) 40 MG tablet Take 1 tablet (40 mg total) by mouth daily. 06/22/13  Yes Rogene Houston, MD  promethazine (PHENERGAN) 12.5 MG tablet Take 12.5 mg by mouth every 4 (four) hours as needed for nausea or vomiting.   Yes Historical Provider, MD   BP 149/67  Pulse 86  Temp(Src) 97.9 F (36.6 C) (Oral)  Resp 20  Ht 5\' 4"  (1.626 m)  Wt 147 lb (66.679 kg)  BMI 25.22 kg/m2  SpO2 94%  Vital signs normal     Physical Exam  Nursing note and vitals reviewed. Constitutional: She is oriented to person, place, and time. She appears well-developed and well-nourished.  Non-toxic appearance. She does not appear ill. No distress.  HENT:  Head: Normocephalic and atraumatic.  Right Ear: External ear normal.  Left Ear: External ear normal.  Nose: Nose normal. No mucosal edema or rhinorrhea.  Mouth/Throat: Oropharynx is clear and moist and mucous membranes are normal. No dental abscesses or uvula swelling.  Eyes: Conjunctivae and EOM are normal. Pupils are equal, round, and reactive to light.  Neck: Normal range of motion and full passive range of motion without pain. Neck supple.  Cardiovascular: Normal rate and  regular rhythm.  Exam reveals no gallop and no friction rub.   Murmur heard. Rubbery crescendo systolic murmur, heard best in the left lower sternal border, but radiates throughout her precordium  Pulmonary/Chest: Effort normal and breath sounds normal. No respiratory distress. She has no wheezes. She has no rhonchi. She has no rales. She exhibits no tenderness and no crepitus.  Abdominal: Soft. Normal appearance and bowel sounds are normal. She exhibits no distension. There is no tenderness. There is no rebound and no guarding.  Musculoskeletal: Normal range of motion. She exhibits edema. She exhibits no tenderness.  Well-healed midline surgical scar of right knee, consistent with a recent total knee replacement. Trace-to-1+ pitting edema halfway up legs bilaterally, but right leg is diffusely larger  than left c/w recent TKR Moves all extremities well.   Neurological: She is alert and oriented to person, place, and time. She has normal strength. No cranial nerve deficit.  Skin: Skin is warm, dry and intact. No rash noted. No erythema. No pallor.  Psychiatric: She has a normal mood and affect. Her speech is normal and behavior is normal. Her mood appears not anxious.    ED Course  Procedures (including critical care time)  Medications  sodium chloride 0.9 % injection (not administered)  furosemide (LASIX) injection 60 mg (not administered)  nitroGLYCERIN (NITROGLYN) 2 % ointment 1 inch (not administered)  iohexol (OMNIPAQUE) 350 MG/ML injection 100 mL (100 mLs Intravenous Contrast Given 04/03/14 1645)     DIAGNOSTIC STUDIES: Oxygen Saturation is 94% on room air, adequate by my interpretation.    COORDINATION OF CARE: 3:20 PM-Discussed treatment plan which includes CXR, labs and EKG with pt at bedside and pt agreed to plan.    16 10 patient given her test results.  We are going to do CT image of her chest rule out PE.  Patient is high risk because she has atrial fibrillation with  coagulation just with aspirin, she also was recently diagnosed with rectal cancer.  If her CT and [place] negative I would start her on medication for congestive heart failure.  On review of Dr. Debara Pickett prior note she does not have a previous diagnosis of congestive heart failure.  There is no echo in the chart, her last one was ordered in 2012 was canceled.  Patient given results of her CT scan which shows no PE.  However it does shows signs of congestive heart failure.  This is a new diagnosis.  Patient is agreeable to being admitted.  She was given IV Lasix and nitroglycerin paste was ordered to put on her chest.  17:43 Dr Humphrey Rolls, will see patient and do admission orders.   Labs Review Results for orders placed during the hospital encounter of 04/03/14  CBC WITH DIFFERENTIAL      Result Value Ref Range   WBC 5.6  4.0 - 10.5 K/uL   RBC 4.40  3.87 - 5.11 MIL/uL   Hemoglobin 12.7  12.0 - 15.0 g/dL   HCT 40.6  36.0 - 46.0 %   MCV 92.3  78.0 - 100.0 fL   MCH 28.9  26.0 - 34.0 pg   MCHC 31.3  30.0 - 36.0 g/dL   RDW 19.1 (*) 11.5 - 15.5 %   Platelets 202  150 - 400 K/uL   Neutrophils Relative % 70  43 - 77 %   Neutro Abs 4.0  1.7 - 7.7 K/uL   Lymphocytes Relative 21  12 - 46 %   Lymphs Abs 1.2  0.7 - 4.0 K/uL   Monocytes Relative 6  3 - 12 %   Monocytes Absolute 0.3  0.1 - 1.0 K/uL   Eosinophils Relative 2  0 - 5 %   Eosinophils Absolute 0.1  0.0 - 0.7 K/uL   Basophils Relative 1  0 - 1 %   Basophils Absolute 0.0  0.0 - 0.1 K/uL  COMPREHENSIVE METABOLIC PANEL      Result Value Ref Range   Sodium 141  137 - 147 mEq/L   Potassium 4.1  3.7 - 5.3 mEq/L   Chloride 100  96 - 112 mEq/L   CO2 27  19 - 32 mEq/L   Glucose, Bld 95  70 - 99 mg/dL   BUN 26 (*) 6 -  23 mg/dL   Creatinine, Ser 0.70  0.50 - 1.10 mg/dL   Calcium 9.5  8.4 - 10.5 mg/dL   Total Protein 7.0  6.0 - 8.3 g/dL   Albumin 3.2 (*) 3.5 - 5.2 g/dL   AST 18  0 - 37 U/L   ALT 9  0 - 35 U/L   Alkaline Phosphatase 97  39 - 117 U/L    Total Bilirubin 1.9 (*) 0.3 - 1.2 mg/dL   GFR calc non Af Amer 81 (*) >90 mL/min   GFR calc Af Amer >90  >90 mL/min  TROPONIN I      Result Value Ref Range   Troponin I <0.30  <0.30 ng/mL  PRO B NATRIURETIC PEPTIDE      Result Value Ref Range   Pro B Natriuretic peptide (BNP) 7099.0 (*) 0 - 450 pg/mL  DIGOXIN LEVEL      Result Value Ref Range   Digoxin Level 0.6 (*) 0.8 - 2.0 ng/mL    Laboratory interpretation all normal except elevated BNP no old to compare    Imaging Review Dg Chest 2 View  04/03/2014   CLINICAL DATA:  Shortness of breath.  EXAM: CHEST  2 VIEW  COMPARISON:  01/04/2014 and 12/24/2013  FINDINGS: Lungs are adequately inflated without focal consolidation or effusion. There is minimal the right per mediastinal density unchanged likely due in part to patient's rotation towards the right. There is moderate to severe stable cardiomegaly. Remainder the exam is unchanged.  IMPRESSION: Moderate to severe stable cardiomegaly. No acute cardiopulmonary disease.   Electronically Signed   By: Marin Olp M.D.   On: 04/03/2014 15:57   Ct Angio Chest W/cm &/or Wo Cm  04/03/2014   CLINICAL DATA:  Shortness of breath. Atrial fibrillation. Recently diagnosed with colon carcinoma and taken off anticoagulation. Clinical suspicion for pulmonary embolism.  EXAM: CT ANGIOGRAPHY CHEST WITH CONTRAST  TECHNIQUE: Multidetector CT imaging of the chest was performed using the standard protocol during bolus administration of intravenous contrast. Multiplanar CT image reconstructions and MIPs were obtained to evaluate the vascular anatomy.  CONTRAST:  128mL OMNIPAQUE IOHEXOL 350 MG/ML SOLN  COMPARISON:  03/14/2014  FINDINGS: Satisfactory opacification of pulmonary arteries noted, and no pulmonary emboli identified. No evidence of thoracic aortic dissection or aneurysm. No evidence of mediastinal hematoma or mass.  Marked cardiomegaly is stable and enlarged central pulmonary arteries remain suspicious for  pulmonary arterial hypertension. Reflux of contrast into the IVC and hepatic veins is consistent with right heart insufficiency.  New small right pleural effusion is seen. Mild diffuse granular pulmonary opacity and interstitial prominence is suspicious for mild interstitial edema. No evidence of pulmonary consolidation. Scattered tiny sub-cm bilateral pulmonary nodules remain stable. Early pulmonary metastases cannot definitely be excluded in the setting of colon cancer.  Review of the MIP images confirms the above findings.  IMPRESSION: No evidence of pulmonary embolism.  New small right pleural effusion and probable mild interstitial edema, suspicious for mild congestive heart failure.  Stable cardiomegaly and enlarged central pulmonary arteries, highly suspicious for pulmonary arterial hypertension. Also demonstrated is reflux of contrast into the IVC and hepatic veins, consistent with right heart insufficiency.  Stable tiny bilateral sub-cm pulmonary nodules. Early pulmonary metastases cannot be excluded in the setting of colon cancer. Continued followup by chest CT recommended in 3-4 months.   Electronically Signed   By: Earle Gell M.D.   On: 04/03/2014 17:18    Dg Chest 2 View  04/03/2014   CLINICAL  DATA:  Shortness of breath.d.  IMPRESSION: Moderate to severe stable cardiomegaly. No acute cardiopulmonary disease.   Electronically Signed   By: Marin Olp M.D.   On: 04/03/2014 15:57   Ct Chest W Contrast  03/14/2014   CLINICAL DATA:  Rectal cancer.  IMPRESSION: 1. Small scattered pulmonary nodules are indeterminate but likely benign. Followup CT imaging is suggested in 3 months. 2. Cardiac enlargement and pulmonary artery enlargement. 3. Rectal wall thickening but no discrete mass. No pelvic or abdominal metastatic disease is identified. 4. Diffuse fatty infiltration of the liver. 5. 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   On: 03/14/2014 12:50   Ct Abdomen Pelvis W Contrast  03/14/2014   CLINICAL DATA:  Rectal cancer.   .  IMPRESSION: 1. Small scattered pulmonary nodules are indeterminate but likely benign. Followup CT imaging is suggested in 3 months. 2. Cardiac enlargement and pulmonary artery enlargement. 3. Rectal wall thickening but no discrete mass. No pelvic or abdominal metastatic disease is identified. 4. Diffuse fatty infiltration of the liver. 5. 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   On: 03/14/2014 12:50     EKG Interpretation None        Date: 04/03/2014  Rate: 79  Rhythm: atrial fibrillation  QRS Axis: left  Intervals: normal  ST/T Wave abnormalities: nonspecific ST/T changes  Conduction Disutrbances:right bundle branch block  Narrative Interpretation:   Old EKG Reviewed: none available    MDM   Final diagnoses:  CHF (congestive heart failure)  DOE (dyspnea on exertion)    Plan admission   Rolland Porter, MD, FACEP   I personally performed the services described in this documentation, which was scribed in my presence. The recorded information has been reviewed and considered.  Rolland Porter, MD, Abram Sander    Janice Norrie, MD 04/03/14 [phone]

## 2014-04-04 DIAGNOSIS — I509 Heart failure, unspecified: Secondary | ICD-10-CM

## 2014-04-04 DIAGNOSIS — I10 Essential (primary) hypertension: Secondary | ICD-10-CM

## 2014-04-04 DIAGNOSIS — F0390 Unspecified dementia without behavioral disturbance: Secondary | ICD-10-CM

## 2014-04-04 DIAGNOSIS — I4891 Unspecified atrial fibrillation: Secondary | ICD-10-CM

## 2014-04-04 LAB — COMPREHENSIVE METABOLIC PANEL
ALT: 7 U/L (ref 0–35)
AST: 19 U/L (ref 0–37)
Albumin: 3.1 g/dL — ABNORMAL LOW (ref 3.5–5.2)
Alkaline Phosphatase: 93 U/L (ref 39–117)
BILIRUBIN TOTAL: 2 mg/dL — AB (ref 0.3–1.2)
BUN: 22 mg/dL (ref 6–23)
CHLORIDE: 97 meq/L (ref 96–112)
CO2: 33 meq/L — AB (ref 19–32)
CREATININE: 0.79 mg/dL (ref 0.50–1.10)
Calcium: 9.5 mg/dL (ref 8.4–10.5)
GFR calc Af Amer: >90 mL/min (ref >90)
GFR, EST NON AFRICAN AMERICAN: 78 mL/min — AB (ref >90)
GLUCOSE: 89 mg/dL (ref 70–99)
Potassium: 3.4 mEq/L — ABNORMAL LOW (ref 3.7–5.3)
SODIUM: 144 meq/L (ref 137–147)
Total Protein: 6.6 g/dL (ref 6.0–8.3)

## 2014-04-04 LAB — CBC
HCT: 40.5 % (ref 36.0–46.0)
Hemoglobin: 12.5 g/dL (ref 12.0–15.0)
MCH: 28.4 pg (ref 26.0–34.0)
MCHC: 30.9 g/dL (ref 30.0–36.0)
MCV: 92 fL (ref 78.0–100.0)
PLATELETS: 232 10*3/uL (ref 150–400)
RBC: 4.4 MIL/uL (ref 3.87–5.11)
RDW: 19.1 % — ABNORMAL HIGH (ref 11.5–15.5)
WBC: 5.3 10*3/uL (ref 4.0–10.5)

## 2014-04-04 LAB — HEMOGLOBIN A1C
HEMOGLOBIN A1C: 5.6 % (ref <5.7)
Mean Plasma Glucose: 114 mg/dL (ref <117)

## 2014-04-04 LAB — TROPONIN I: Troponin I: <0.3 ng/mL (ref <0.30)

## 2014-04-04 LAB — GLUCOSE, CAPILLARY: GLUCOSE-CAPILLARY: 84 mg/dL (ref 70–99)

## 2014-04-04 LAB — TSH: TSH: 2.89 u[IU]/mL (ref 0.350–4.500)

## 2014-04-04 MED ORDER — NYSTATIN 100000 UNIT/GM EX POWD
Freq: Three times a day (TID) | CUTANEOUS | Status: DC
  Administered 2014-04-04 – 2014-04-07 (×7): via TOPICAL
  Filled 2014-04-04: qty 15

## 2014-04-04 MED ORDER — POTASSIUM CHLORIDE CRYS ER 20 MEQ PO TBCR
40.0000 meq | EXTENDED_RELEASE_TABLET | Freq: Once | ORAL | Status: AC
  Administered 2014-04-04: 40 meq via ORAL
  Filled 2014-04-04: qty 2

## 2014-04-04 MED ORDER — CLOTRIMAZOLE 1 % VA CREA
1.0000 | TOPICAL_CREAM | Freq: Every day | VAGINAL | Status: DC
  Administered 2014-04-04 – 2014-04-06 (×3): 1 via VAGINAL
  Filled 2014-04-04: qty 45

## 2014-04-04 NOTE — Progress Notes (Signed)
Nutrition Brief Note  Patient identified on the Malnutrition Screening Tool (MST) Report  Wt Readings from Last 15 Encounters:  04/04/14 154 lb 6.4 oz (70.035 kg)  03/14/14 145 lb (65.772 kg)  03/14/14 145 lb (65.772 kg)  02/14/14 149 lb 4.8 oz (67.722 kg)  02/11/14 147 lb 1.6 oz (66.724 kg)  02/03/14 145 lb (65.772 kg)  07/29/13 144 lb 6.4 oz (65.499 kg)  06/22/13 143 lb (64.864 kg)  06/22/13 143 lb (64.864 kg)  06/17/13 143 lb 12.8 oz (65.227 kg)    Body mass index is 26.49 kg/(m^2). Patient meets criteria for overweight based on current BMI. Pt weight hx shows 4 kg weight gain <30 days which is severe and likely related to fluid status.  Her appetite is very good. Her current diet order Heart Healthy is patient is consuming approximately 100% of meals at this time. Labs and medications reviewed.   No nutrition interventions warranted at this time. If nutrition issues arise, please consult RD.   Colman Cater MS,RD,CSG,LDN Office: [phone] Pager: [phone]

## 2014-04-04 NOTE — Progress Notes (Signed)
Utilization Review Complete  

## 2014-04-04 NOTE — Progress Notes (Signed)
Unable to record accurate Intake and Output due to patient being incontinent of urine.  Patient usually uses depends at home.

## 2014-04-04 NOTE — Progress Notes (Signed)
Notified Dr. Grandville Silos of the patient having an "angry" vagina; AEB red, rash, and swollen.  New orders given and followed.

## 2014-04-04 NOTE — Progress Notes (Signed)
[facility] PROGRESS NOTE  Tamara Mitchell ZSW:[id number] DOB: [birth year] DOA: 04/03/2014 PCP: Delphina Cahill, MD  Assessment/Plan: #1 acute CHF exacerbation Patient presented with shortness of breath consistent with CHF exacerbation. Pro BNP elevated at 7099. Cardiac enzymes negative x3. Patient with clinical improvement. I/O. equal -1.035 cc. 2-D echo pending. Continue aspirin, atenolol, IV Lasix, digoxin.  #2 hypertension Stable. Continue atenolol, Lasix.  #3 dementia/depression Stable. Continue Aricept, Cymbalta.   #4 history of rectal cancer Outpatient followup.  #5 history of atrial fibrillation Continue atenolol and digoxin for rate control. Aspirin.  Code Status: Full Family Communication: Updated patient and POA over the phone. Disposition Plan: Home in medically stable.   Consultants:  None  Procedures:  CT angiogram chest 04/03/2014  Chest x-ray 04/03/2014  Antibiotics:  None  HPI/Subjective: This is a shortness of breath has improved. No complaints.  Objective: Filed Vitals:   04/04/14 1531  BP: 122/82  Pulse: 66  Temp: 97.5 F (36.4 C)  Resp: 20    Intake/Output Summary (Last 24 hours) at 04/04/14 1819 Last data filed at 04/04/14 1818  Gross per 24 hour  Intake    960 ml  Output   1375 ml  Net   -415 ml   Filed Weights   04/03/14 1353 04/03/14 1928 04/04/14 0608  Weight: 66.679 kg (147 lb) 71.532 kg (157 lb 11.2 oz) 70.035 kg (154 lb 6.4 oz)    Exam:   General:  NAD  Cardiovascular: RRR  Respiratory: Bibasilar crackles. No wheezing.  Abdomen: Soft, nontender, nondistended, positive bowel sounds.  Musculoskeletal: No clubbing or cyanosis. Trace edema.  Data Reviewed: Basic Metabolic Panel:  Recent Labs Lab 04/03/14 1524 04/04/14 0421  NA 141 144  K 4.1 3.4*  CL 100 97  CO2 27 33*  GLUCOSE 95 89  BUN 26* 22  CREATININE 0.70 0.79  CALCIUM 9.5 9.5   Liver Function Tests:  Recent Labs Lab 04/03/14 1524  04/04/14 0421  AST 18 19  ALT 9 7  ALKPHOS 97 93  BILITOT 1.9* 2.0*  PROT 7.0 6.6  ALBUMIN 3.2* 3.1*   No results found for this basename: LIPASE, AMYLASE,  in the last 168 hours No results found for this basename: AMMONIA,  in the last 168 hours CBC:  Recent Labs Lab 04/03/14 1524 04/04/14 0421  WBC 5.6 5.3  NEUTROABS 4.0  --   HGB 12.7 12.5  HCT 40.6 40.5  MCV 92.3 92.0  PLT 202 232   Cardiac Enzymes:  Recent Labs Lab 04/03/14 1524 04/03/14 2120 04/04/14 0421 04/04/14 1018  TROPONINI <0.30 <0.30 <0.30 <0.30   BNP (last 3 results)  Recent Labs  04/03/14 1524  PROBNP 7099.0*   CBG:  Recent Labs Lab 04/04/14 0737  GLUCAP 84    No results found for this or any previous visit (from the past 240 hour(s)).   Studies: Dg Chest 2 View  04/03/2014   CLINICAL DATA:  Shortness of breath.  EXAM: CHEST  2 VIEW  COMPARISON:  01/04/2014 and 12/24/2013  FINDINGS: Lungs are adequately inflated without focal consolidation or effusion. There is minimal the right per mediastinal density unchanged likely due in part to patient's rotation towards the right. There is moderate to severe stable cardiomegaly. Remainder the exam is unchanged.  IMPRESSION: Moderate to severe stable cardiomegaly. No acute cardiopulmonary disease.   Electronically Signed   By: Marin Olp M.D.   On: 04/03/2014 15:57   Ct Angio Chest W/cm &/or Wo Cm  04/03/2014  CLINICAL DATA:  Shortness of breath. Atrial fibrillation. Recently diagnosed with colon carcinoma and taken off anticoagulation. Clinical suspicion for pulmonary embolism.  EXAM: CT ANGIOGRAPHY CHEST WITH CONTRAST  TECHNIQUE: Multidetector CT imaging of the chest was performed using the standard protocol during bolus administration of intravenous contrast. Multiplanar CT image reconstructions and MIPs were obtained to evaluate the vascular anatomy.  CONTRAST:  170mL OMNIPAQUE IOHEXOL 350 MG/ML SOLN  COMPARISON:  03/14/2014  FINDINGS: Satisfactory  opacification of pulmonary arteries noted, and no pulmonary emboli identified. No evidence of thoracic aortic dissection or aneurysm. No evidence of mediastinal hematoma or mass.  Marked cardiomegaly is stable and enlarged central pulmonary arteries remain suspicious for pulmonary arterial hypertension. Reflux of contrast into the IVC and hepatic veins is consistent with right heart insufficiency.  New small right pleural effusion is seen. Mild diffuse granular pulmonary opacity and interstitial prominence is suspicious for mild interstitial edema. No evidence of pulmonary consolidation. Scattered tiny sub-cm bilateral pulmonary nodules remain stable. Early pulmonary metastases cannot definitely be excluded in the setting of colon cancer.  Review of the MIP images confirms the above findings.  IMPRESSION: No evidence of pulmonary embolism.  New small right pleural effusion and probable mild interstitial edema, suspicious for mild congestive heart failure.  Stable cardiomegaly and enlarged central pulmonary arteries, highly suspicious for pulmonary arterial hypertension. Also demonstrated is reflux of contrast into the IVC and hepatic veins, consistent with right heart insufficiency.  Stable tiny bilateral sub-cm pulmonary nodules. Early pulmonary metastases cannot be excluded in the setting of colon cancer. Continued followup by chest CT recommended in 3-4 months.   Electronically Signed   By: Earle Gell M.D.   On: 04/03/2014 17:18    Scheduled Meds: . aspirin EC  325 mg Oral Daily  . atenolol  50 mg Oral Daily  . clotrimazole  1 Applicatorful Vaginal QHS  . digoxin  0.125 mg Oral Daily  . docusate sodium  100 mg Oral BID  . donepezil  10 mg Oral QHS  . DULoxetine  30 mg Oral Daily  . folic acid  1 mg Oral Daily  . furosemide  40 mg Intravenous Daily  . heparin  5,000 Units Subcutaneous 3 times per day  . multivitamin with minerals  1 tablet Oral Daily  . nystatin   Topical TID  . pantoprazole  40  mg Oral Daily  . sodium chloride  3 mL Intravenous Q12H  . sodium chloride  3 mL Intravenous Q12H  . thiamine  100 mg Oral Daily   Continuous Infusions:   Principal Problem:   CHF (congestive heart failure) Active Problems:   Essential hypertension, benign   Atrial fibrillation   Dementia   Depression    Time spent: 35 mins    Eugenie Filler MD [facility] Pager [phone]. If 7PM-7AM, please contact night-coverage at [email or web address], password [facility] 04/04/2014, 6:19 PM  LOS: 1 day

## 2014-04-05 ENCOUNTER — Encounter (HOSPITAL_COMMUNITY): Payer: Self-pay | Admitting: Cardiology

## 2014-04-05 DIAGNOSIS — I5032 Chronic diastolic (congestive) heart failure: Secondary | ICD-10-CM

## 2014-04-05 DIAGNOSIS — I2781 Cor pulmonale (chronic): Secondary | ICD-10-CM

## 2014-04-05 DIAGNOSIS — I5033 Acute on chronic diastolic (congestive) heart failure: Principal | ICD-10-CM

## 2014-04-05 DIAGNOSIS — I279 Pulmonary heart disease, unspecified: Secondary | ICD-10-CM

## 2014-04-05 DIAGNOSIS — I369 Nonrheumatic tricuspid valve disorder, unspecified: Secondary | ICD-10-CM

## 2014-04-05 LAB — CBC
HCT: 40.6 % (ref 36.0–46.0)
Hemoglobin: 12.9 g/dL (ref 12.0–15.0)
MCH: 29.2 pg (ref 26.0–34.0)
MCHC: 31.8 g/dL (ref 30.0–36.0)
MCV: 91.9 fL (ref 78.0–100.0)
PLATELETS: 219 10*3/uL (ref 150–400)
RBC: 4.42 MIL/uL (ref 3.87–5.11)
RDW: 19 % — ABNORMAL HIGH (ref 11.5–15.5)
WBC: 7.6 10*3/uL (ref 4.0–10.5)

## 2014-04-05 LAB — BASIC METABOLIC PANEL
BUN: 24 mg/dL — AB (ref 6–23)
CALCIUM: 9.3 mg/dL (ref 8.4–10.5)
CO2: 32 mEq/L (ref 19–32)
CREATININE: 0.83 mg/dL (ref 0.50–1.10)
Chloride: 100 mEq/L (ref 96–112)
GFR, EST AFRICAN AMERICAN: 76 mL/min — AB (ref >90)
GFR, EST NON AFRICAN AMERICAN: 66 mL/min — AB (ref >90)
Glucose, Bld: 109 mg/dL — ABNORMAL HIGH (ref 70–99)
Potassium: 3.8 mEq/L (ref 3.7–5.3)
Sodium: 142 mEq/L (ref 137–147)

## 2014-04-05 LAB — GLUCOSE, CAPILLARY: Glucose-Capillary: 83 mg/dL (ref 70–99)

## 2014-04-05 LAB — PRO B NATRIURETIC PEPTIDE: Pro B Natriuretic peptide (BNP): 2771 pg/mL — ABNORMAL HIGH (ref 0–450)

## 2014-04-05 NOTE — Progress Notes (Signed)
  Echocardiogram 2D Echocardiogram has been performed.  Su Grand Nuria Phebus 04/05/2014, 9:55 AM

## 2014-04-05 NOTE — Progress Notes (Signed)
[facility] PROGRESS NOTE  Tamara Mitchell HUT:[id number] DOB: 1934-04-25 DOA: 04/03/2014 PCP: Delphina Cahill, MD  Assessment/Plan: #1 acute on chronic diastolic CHF exacerbation Clinical improvement. Patient presented with shortness of breath consistent with CHF exacerbation. Pro BNP elevated at 7099 on admission. Pro BNP trending down and currently at 2771. I/O. is inaccurate. Foley catheter has been placed. Cardiac enzymes negative x3. 2-D echo with EF of 55-60% with moderate LVH, right ventricular pressure and volume overload with severe biatrial enlargement. Evidence of severe pulmonary hypertension with moderate tricuspid and mitral regurgitation and a PASP 68 mmHg. Patient has been seen by cardiology and recommend continuing diureses with IV Lasix. Continue aspirin, atenolol, digoxin. Cardiology following and appreciate input and recommendations.  #2 hypertension Stable. Continue atenolol, Lasix.  #3 dementia/depression Stable. Continue Aricept, Cymbalta.   #4 history of rectal cancer Outpatient followup.  #5 history of atrial fibrillation Continue atenolol and digoxin for rate control. Aspirin. Per cardiology will not resume anticoagulation at this point with rectal carcinoma, history of recurrent GI bleed and impending radiation and chemotherapy. Cardiology following and appreciate input and recommendations.  Code Status: Full Family Communication: Updated patient and POA. Disposition Plan: Home in medically stable.   Consultants:  Cardiology: Dr. Domenic Polite 04/05/2014  Procedures:  CT angiogram chest 04/03/2014  Chest x-ray 04/03/2014  2-D echo 04/05/2014  Antibiotics:  None  HPI/Subjective: Patient states shortness of breath has improved. No complaints.  Objective: Filed Vitals:   04/05/14 1518  BP: 109/72  Pulse: 72  Temp: 98.3 F (36.8 C)  Resp: 20    Intake/Output Summary (Last 24 hours) at 04/05/14 1644 Last data filed at 04/05/14 1109  Gross per 24  hour  Intake    246 ml  Output      0 ml  Net    246 ml   Filed Weights   04/03/14 1928 04/04/14 0608 04/05/14 0500  Weight: 71.532 kg (157 lb 11.2 oz) 70.035 kg (154 lb 6.4 oz) 67.3 kg (148 lb 5.9 oz)    Exam:   General:  NAD  Cardiovascular: RRR  Respiratory: Bibasilar crackles. No wheezing.  Abdomen: Soft, nontender, nondistended, positive bowel sounds.  Musculoskeletal: Trace-1 + BLE edema  Data Reviewed: Basic Metabolic Panel:  Recent Labs Lab 04/03/14 1524 04/04/14 0421 04/05/14 0558  NA 141 144 142  K 4.1 3.4* 3.8  CL 100 97 100  CO2 27 33* 32  GLUCOSE 95 89 109*  BUN 26* 22 24*  CREATININE 0.70 0.79 0.83  CALCIUM 9.5 9.5 9.3   Liver Function Tests:  Recent Labs Lab 04/03/14 1524 04/04/14 0421  AST 18 19  ALT 9 7  ALKPHOS 97 93  BILITOT 1.9* 2.0*  PROT 7.0 6.6  ALBUMIN 3.2* 3.1*   No results found for this basename: LIPASE, AMYLASE,  in the last 168 hours No results found for this basename: AMMONIA,  in the last 168 hours CBC:  Recent Labs Lab 04/03/14 1524 04/04/14 0421 04/05/14 0558  WBC 5.6 5.3 7.6  NEUTROABS 4.0  --   --   HGB 12.7 12.5 12.9  HCT 40.6 40.5 40.6  MCV 92.3 92.0 91.9  PLT 202 232 219   Cardiac Enzymes:  Recent Labs Lab 04/03/14 1524 04/03/14 2120 04/04/14 0421 04/04/14 1018  TROPONINI <0.30 <0.30 <0.30 <0.30   BNP (last 3 results)  Recent Labs  04/03/14 1524 04/05/14 0558  PROBNP 7099.0* 2771.0*   CBG:  Recent Labs Lab 04/04/14 0737 04/05/14 0740  GLUCAP 84 83  No results found for this or any previous visit (from the past 240 hour(s)).   Studies: Ct Angio Chest W/cm &/or Wo Cm  04/03/2014   CLINICAL DATA:  Shortness of breath. Atrial fibrillation. Recently diagnosed with colon carcinoma and taken off anticoagulation. Clinical suspicion for pulmonary embolism.  EXAM: CT ANGIOGRAPHY CHEST WITH CONTRAST  TECHNIQUE: Multidetector CT imaging of the chest was performed using the standard protocol  during bolus administration of intravenous contrast. Multiplanar CT image reconstructions and MIPs were obtained to evaluate the vascular anatomy.  CONTRAST:  1102mL OMNIPAQUE IOHEXOL 350 MG/ML SOLN  COMPARISON:  03/14/2014  FINDINGS: Satisfactory opacification of pulmonary arteries noted, and no pulmonary emboli identified. No evidence of thoracic aortic dissection or aneurysm. No evidence of mediastinal hematoma or mass.  Marked cardiomegaly is stable and enlarged central pulmonary arteries remain suspicious for pulmonary arterial hypertension. Reflux of contrast into the IVC and hepatic veins is consistent with right heart insufficiency.  New small right pleural effusion is seen. Mild diffuse granular pulmonary opacity and interstitial prominence is suspicious for mild interstitial edema. No evidence of pulmonary consolidation. Scattered tiny sub-cm bilateral pulmonary nodules remain stable. Early pulmonary metastases cannot definitely be excluded in the setting of colon cancer.  Review of the MIP images confirms the above findings.  IMPRESSION: No evidence of pulmonary embolism.  New small right pleural effusion and probable mild interstitial edema, suspicious for mild congestive heart failure.  Stable cardiomegaly and enlarged central pulmonary arteries, highly suspicious for pulmonary arterial hypertension. Also demonstrated is reflux of contrast into the IVC and hepatic veins, consistent with right heart insufficiency.  Stable tiny bilateral sub-cm pulmonary nodules. Early pulmonary metastases cannot be excluded in the setting of colon cancer. Continued followup by chest CT recommended in 3-4 months.   Electronically Signed   By: Earle Gell M.D.   On: 04/03/2014 17:18    Scheduled Meds: . aspirin EC  325 mg Oral Daily  . atenolol  50 mg Oral Daily  . clotrimazole  1 Applicatorful Vaginal QHS  . digoxin  0.125 mg Oral Daily  . docusate sodium  100 mg Oral BID  . donepezil  10 mg Oral QHS  .  DULoxetine  30 mg Oral Daily  . folic acid  1 mg Oral Daily  . furosemide  40 mg Intravenous Daily  . heparin  5,000 Units Subcutaneous 3 times per day  . multivitamin with minerals  1 tablet Oral Daily  . nystatin   Topical TID  . pantoprazole  40 mg Oral Daily  . sodium chloride  3 mL Intravenous Q12H  . sodium chloride  3 mL Intravenous Q12H  . thiamine  100 mg Oral Daily   Continuous Infusions:   Principal Problem:   CHF (congestive heart failure) Active Problems:   Essential hypertension, benign   Atrial fibrillation   Dementia   Depression   Acute on chronic diastolic heart failure   Cor pulmonale    Time spent: 35 mins    Eugenie Filler MD [facility] Pager [phone]. If 7PM-7AM, please contact night-coverage at [email or web address], password [facility] 04/05/2014, 4:44 PM  LOS: 2 days

## 2014-04-05 NOTE — Consult Note (Signed)
Primary cardiologist: Dr. Pixie Casino Consulting cardiologist: Dr. Satira Sark  Clinical Summary Ms. Sandell is a 80 y.o.female currently admitted with recent worsening shortness of breath. She has been recently diagnosed with rectal carcinoma and is to begin treatment with radiation and chemotherapy, reportedly surgery is not planned. She has permanent atrial fibrillation, recently taken off anticoagulants in the setting of recurrent GI bleeding leading up to the diagnosis mentioned above. She is now on aspirin, heart rate is controlled on combination of atenolol and digoxin.   She has evidence of diastolic heart failure complicated by cor pulmonale and severe pulmonary hypertension. She is feeling better with IV diuresis, Foley was placed today due to somewhat inaccurate intake and output recordings. She was not on a diuretic at home based on review of the chart.  Followup echocardiogram is reported below.   No Known Allergies  Medications Scheduled Medications: . aspirin EC  325 mg Oral Daily  . atenolol  50 mg Oral Daily  . clotrimazole  1 Applicatorful Vaginal QHS  . digoxin  0.125 mg Oral Daily  . docusate sodium  100 mg Oral BID  . donepezil  10 mg Oral QHS  . DULoxetine  30 mg Oral Daily  . folic acid  1 mg Oral Daily  . furosemide  40 mg Intravenous Daily  . heparin  5,000 Units Subcutaneous 3 times per day  . multivitamin with minerals  1 tablet Oral Daily  . nystatin   Topical TID  . pantoprazole  40 mg Oral Daily  . sodium chloride  3 mL Intravenous Q12H  . sodium chloride  3 mL Intravenous Q12H  . thiamine  100 mg Oral Daily    PRN Medications: sodium chloride, acetaminophen, acetaminophen, alum & mag hydroxide-simeth, bisacodyl, fluticasone, HYDROcodone-acetaminophen, ondansetron (ZOFRAN) IV, ondansetron, sodium chloride   Past Medical History  Diagnosis Date  . Essential hypertension, benign   . Permanent atrial fibrillation   . Mild dementia     . Mitral insufficiency     Mild to moderate  . Tricuspid insufficiency     Moderate  . Pulmonary hypertension     Severe with RV dysfunction  . Chronic venous insufficiency   . History of nuclear stress test 12/12/05/2010    Lexiscan; non-diagnostic for ischemia, low risk   . Depression   . GI bleed     Xarelto stopped  . Rectal polyp     Tubulovillous adenoma with high-grade dysplasia but no evidence of invasion    Past Surgical History  Procedure Laterality Date  . Cholecystectomy  2006  . Total knee arthroplasty Left 2013  . Tonsillectomy    . Colonoscopy with esophagogastroduodenoscopy (egd) N/A 06/22/2013    Procedure: COLONOSCOPY WITH ESOPHAGOGASTRODUODENOSCOPY (EGD);  Surgeon: Rogene Houston, MD;  Location: [facility];  Service: Endoscopy;  Laterality: N/A;  730  . Transthoracic echocardiogram  10/24/2012    EF=50-55%, mod conc LVH; RV mod-severely dilated & systolic function mildly reduced; LA severely dialted, possible small PFO; mild mitral annular calcif & mild MR; mod TR & elevated RV systolic pressure, mod pulm HTN; AV mildyl sclerotic & mild-mod regurg (performed for murmur & afib)  . Total knee arthroplasty Right 12/07/2013  . Flexible sigmoidoscopy N/A 03/14/2014    Procedure: FLEXIBLE SIGMOIDOSCOPY;  Surgeon: Rogene Houston, MD;  Location: [facility];  Service: Endoscopy;  Laterality: N/A;  730  . Polypectomy N/A 03/14/2014    Procedure: POLYPECTOMY;  Surgeon: Rogene Houston, MD;  Location: [facility];  Service: Endoscopy;  Laterality: N/A;    Family History  Problem Relation Age of Onset  . Colon cancer Neg Hx     Social History Ms. Holeman reports that she has never smoked. She has never used smokeless tobacco. Ms. Sperry reports that she does not drink alcohol.  Review of Systems Somewhat hard of hearing. No recent chest pain or sense of palpitations. Shortness of breath noticeable over the last week, also some orthopnea. She reports chronic leg  edema.  Physical Examination Blood pressure 109/72, pulse 72, temperature 98.3 F (36.8 C), temperature source Oral, resp. rate 20, height 5\' 4"  (1.626 m), weight 148 lb 5.9 oz (67.3 kg), SpO2 98.00%.  Intake/Output Summary (Last 24 hours) at 04/05/14 1522 Last data filed at 04/05/14 1109  Gross per 24 hour  Intake    246 ml  Output      0 ml  Net    246 ml   Telemetry: Rate-controlled atrial fibrillation.  Patient appears comfortable at rest. HEENT: Conjunctiva and lids normal, oropharynx clear. Neck: Supple, elevated JVP, no carotid bruits, no thyromegaly. Lungs: Decreased breath sounds particularly at the bases, nonlabored breathing at rest. Cardiac: Irregularly irregular, no S3, 2/6 apical systolic murmur, prominent S2, no pericardial rub. Abdomen: Soft, nontender, bowel sounds present, no guarding or rebound. Extremities: Chronic appearing edema, distal pulses 2+. Skin: Warm and dry. Musculoskeletal: Mild kyphosis. Neuropsychiatric: Alert and oriented x3, affect grossly appropriate.   Lab Results  Basic Metabolic Panel:  Recent Labs Lab 04/03/14 1524 04/04/14 0421 04/05/14 0558  NA 141 144 142  K 4.1 3.4* 3.8  CL 100 97 100  CO2 27 33* 32  GLUCOSE 95 89 109*  BUN 26* 22 24*  CREATININE 0.70 0.79 0.83  CALCIUM 9.5 9.5 9.3    Liver Function Tests:  Recent Labs Lab 04/03/14 1524 04/04/14 0421  AST 18 19  ALT 9 7  ALKPHOS 97 93  BILITOT 1.9* 2.0*  PROT 7.0 6.6  ALBUMIN 3.2* 3.1*    CBC:  Recent Labs Lab 04/03/14 1524 04/04/14 0421 04/05/14 0558  WBC 5.6 5.3 7.6  NEUTROABS 4.0  --   --   HGB 12.7 12.5 12.9  HCT 40.6 40.5 40.6  MCV 92.3 92.0 91.9  PLT 202 232 219    Cardiac Enzymes:  Recent Labs Lab 04/03/14 1524 04/03/14 2120 04/04/14 0421 04/04/14 1018  TROPONINI <0.30 <0.30 <0.30 <0.30    ECG Rate controlled atrial fibrillation with low voltage and right bundle branch block.  Echocardiogram (04/05/14) Study Conclusions  -  Left ventricle: The cavity size was normal. Wall thickness was increased in a pattern of moderate LVH. Systolic function was normal. The estimated ejection fraction was in the range of 55% to 60%. Wall motion was normal; there were no regional wall motion abnormalities. The study is not technically sufficient to allow evaluation of LV diastolic function. Doppler parameters are consistent with high ventricular filling pressure. - Ventricular septum: The contour showed diastolic flattening and systolic flattening. - Aortic valve: Trileaflet; mildly calcified leaflets. There was mild regurgitation. Peak gradient (S): 15 mm Hg. - Mitral valve: There was mild to moderate regurgitation directed posteriorly. Mean gradient (D): 1 mm Hg. - Left atrium: The atrium was severely dilated. - Right ventricle: The cavity size was severely dilated. Systolic function was mildly to moderately reduced. - Right atrium: The atrium was severely dilated. Central venous pressure (est): 15 mm Hg. - Atrial septum: There was an apparent patent  foramen ovale. Left to right flow visualized via color Doppler. - Tricuspid valve: There was moderate regurgitation. - Pulmonic valve: Peak gradient (S): 16 mm Hg. - Pulmonary arteries: Systolic pressure was severely increased. PA peak pressure: 68 mm Hg (S). - Pericardium, extracardiac: A trivial pericardial effusion was identified.  Impressions:  - Moderate LVH with normal LV chamber size and LVEF 55-60%. Indeterminate diastolic function with evidence of increased filling pressures. There is diastolic and systolic septal flattening consistent with RV pressure and volume overload. Severe biatrial enlargement. Mild to moderate, posteriorly directed mitral regurgitation. Sclerotic aortic valve with overall mild aortic regurgitation. Severely dilated RV with mildly to moderately reduced contraction. There is evidence of severe pulmonary hypertension with moderate  tricuspid regurgitation and PASP 68 mmHg, elevated CVP as well. Trivial pericardial effusion noted.   Imaging CT ANGIOGRAPHY CHEST WITH CONTRAST  TECHNIQUE: Multidetector CT imaging of the chest was performed using the standard protocol during bolus administration of intravenous contrast. Multiplanar CT image reconstructions and MIPs were obtained to evaluate the vascular anatomy.  CONTRAST: 150mL OMNIPAQUE IOHEXOL 350 MG/ML SOLN  COMPARISON: 03/14/2014  FINDINGS: Satisfactory opacification of pulmonary arteries noted, and no pulmonary emboli identified. No evidence of thoracic aortic dissection or aneurysm. No evidence of mediastinal hematoma or mass.  Marked cardiomegaly is stable and enlarged central pulmonary arteries remain suspicious for pulmonary arterial hypertension. Reflux of contrast into the IVC and hepatic veins is consistent with right heart insufficiency.  New small right pleural effusion is seen. Mild diffuse granular pulmonary opacity and interstitial prominence is suspicious for mild interstitial edema. No evidence of pulmonary consolidation. Scattered tiny sub-cm bilateral pulmonary nodules remain stable. Early pulmonary metastases cannot definitely be excluded in the setting of colon cancer.  Review of the MIP images confirms the above findings.  IMPRESSION: No evidence of pulmonary embolism.  New small right pleural effusion and probable mild interstitial edema, suspicious for mild congestive heart failure.  Stable cardiomegaly and enlarged central pulmonary arteries, highly suspicious for pulmonary arterial hypertension. Also demonstrated is reflux of contrast into the IVC and hepatic veins, consistent with right heart insufficiency.  Stable tiny bilateral sub-cm pulmonary nodules. Early pulmonary metastases cannot be excluded in the setting of colon cancer. Continued followup by chest CT recommended in 3-4 months.   Impression  1. Acute on  chronic diastolic heart failure complicated by cor pulmonale and severe pulmonary hypertension with mitral and tricuspid regurgitation as detailed above. Patient was not on diuretic as an outpatient, is feeling better with IV diuresis.  2. Permanent atrial fibrillation managed with strategy of heart rate control. She was taken off anticoagulants in light of recurrent GI bleeding and recent diagnosis of rectal carcinoma.  3. History of recurrent GI bleeding and recent diagnosis of rectal carcinoma, pending treatment with radiation and chemotherapy. Reportedly surgery is not planned with fairly local disease.  4. Mild dementia.  5. Hypertension.   Recommendations  Discussed with patient and healthcare power of attorney at bedside. Agree with continued IV diuresis and placement of Foley catheter for accurate assessment of intake and output. She will need to be on oral diuretic at discharge, otherwise plan to continue heart rate control and aspirin for management of atrial fibrillation. Would not resume anticoagulant at this point with rectal carcinoma and history of recurrent GI bleeding, particularly with pending radiation and chemotherapy. We will follow with you.   Satira Sark, M.D., F.A.C.C.

## 2014-04-05 NOTE — Clinical Documentation Improvement (Signed)
PLEASE SPECIFY THE TYPE OF CHF: Possible Clinical Conditions?  Chronic Systolic Congestive Heart Failure Chronic Diastolic Congestive Heart Failure Chronic Systolic & Diastolic Congestive Heart Failure Acute Systolic Congestive Heart Failure Acute Diastolic Congestive Heart Failure Acute Systolic & Diastolic Congestive Heart Failure Acute on Chronic Systolic Congestive Heart Failure Acute on Chronic Diastolic Congestive Heart Failure Acute on Chronic Systolic & Diastolic Congestive Heart Failure Other Condition Cannot Clinically Determine  Supporting Information:(As per notes) "#1 acute CHF exacerbation  Patient presented with shortness of breath consistent with CHF exacerbation. Pro BNP elevated at 7099. Cardiac enzymes negative x3. Patient with clinical improvement. I/O. equal -1.035 cc. 2-D echo pending. Continue aspirin, atenolol, IV Lasix, digoxin."   Risk Factors: Signs & Symptoms: Diagnostics: Treatment:  Thank You, Alessandra Grout ,RN Clinical Documentation Specialist:  [facility] [employer]

## 2014-04-05 NOTE — Care Management Note (Signed)
    Page 1 of 1   04/06/2014     1:40:55 PM CARE MANAGEMENT NOTE 04/06/2014  Patient:  Tamara Mitchell,Tamara Mitchell   Account Number:  [id number]  Date Initiated:  04/05/2014  Documentation initiated by:  Claretha Cooper  Subjective/Objective Assessment:   Pt lives alone during the day and brother stays there at night. Per Daughter, the brother is disabled and unable to assist in her care. Daughter would like SNF at [facility] since she will be having Chemo and Radiation at [facility].     Action/Plan:   Anticipated DC Date:     Anticipated DC Plan:  [place]  CM consult      Choice offered to / List presented to:          [facility] [address] arranged  HH-2 PT      [place].   Status of service:  Completed, signed off Medicare Important Message given?  YES (If response is "NO", the following Medicare IM given date fields will be blank) Date Medicare IM given:  04/06/2014 Date Additional Medicare IM given:    Discharge Disposition:    Per UR Regulation:    If discussed at Long Length of Stay Meetings, dates discussed:    Comments:  04/05/14 Claretha Cooper RN BSN CM

## 2014-04-06 LAB — BASIC METABOLIC PANEL WITH GFR
BUN: 21 mg/dL (ref 6–23)
CO2: 33 meq/L — ABNORMAL HIGH (ref 19–32)
Calcium: 9.2 mg/dL (ref 8.4–10.5)
Chloride: 95 meq/L — ABNORMAL LOW (ref 96–112)
Creatinine, Ser: 0.72 mg/dL (ref 0.50–1.10)
GFR calc Af Amer: >90 mL/min
GFR calc non Af Amer: 80 mL/min — ABNORMAL LOW
Glucose, Bld: 96 mg/dL (ref 70–99)
Potassium: 3.3 meq/L — ABNORMAL LOW (ref 3.7–5.3)
Sodium: 139 meq/L (ref 137–147)

## 2014-04-06 LAB — CBC
HEMATOCRIT: 39.8 % (ref 36.0–46.0)
HEMATOCRIT: 43.2 % (ref 36.0–46.0)
Hemoglobin: 12.6 g/dL (ref 12.0–15.0)
Hemoglobin: 13.8 g/dL (ref 12.0–15.0)
MCH: 28.9 pg (ref 26.0–34.0)
MCH: 29.4 pg (ref 26.0–34.0)
MCHC: 31.7 g/dL (ref 30.0–36.0)
MCHC: 31.9 g/dL (ref 30.0–36.0)
MCV: 91.3 fL (ref 78.0–100.0)
MCV: 91.9 fL (ref 78.0–100.0)
PLATELETS: 213 10*3/uL (ref 150–400)
Platelets: 212 10*3/uL (ref 150–400)
RBC: 4.36 MIL/uL (ref 3.87–5.11)
RBC: 4.7 MIL/uL (ref 3.87–5.11)
RDW: 18.7 % — ABNORMAL HIGH (ref 11.5–15.5)
RDW: 18.5 % — ABNORMAL HIGH (ref 11.5–15.5)
WBC: 7 10*3/uL (ref 4.0–10.5)
WBC: 7.4 10*3/uL (ref 4.0–10.5)

## 2014-04-06 LAB — PRO B NATRIURETIC PEPTIDE: Pro B Natriuretic peptide (BNP): 2663 pg/mL — ABNORMAL HIGH (ref 0–450)

## 2014-04-06 LAB — GLUCOSE, CAPILLARY: GLUCOSE-CAPILLARY: 136 mg/dL — AB (ref 70–99)

## 2014-04-06 MED ORDER — POTASSIUM CHLORIDE CRYS ER 20 MEQ PO TBCR
20.0000 meq | EXTENDED_RELEASE_TABLET | Freq: Every day | ORAL | Status: DC
  Administered 2014-04-06 – 2014-04-07 (×2): 20 meq via ORAL
  Filled 2014-04-06 (×2): qty 1

## 2014-04-06 MED ORDER — FUROSEMIDE 40 MG PO TABS
40.0000 mg | ORAL_TABLET | Freq: Every day | ORAL | Status: DC
  Administered 2014-04-07: 40 mg via ORAL
  Filled 2014-04-06: qty 1

## 2014-04-06 NOTE — Progress Notes (Signed)
Late entry:  Patient's O2 sats on room air at rest 88%.  O2 reapplied.  O2 sats 93%.

## 2014-04-06 NOTE — Progress Notes (Signed)
Utilization Review Complete  

## 2014-04-06 NOTE — Progress Notes (Signed)
Primary cardiologist: Dr. Pixie Casino  Consulting cardiologist: Dr. Satira Sark  Subjective:   Patient up in chair, breathing nonlabored. No palpitations or chest pain.   Objective:   Temp:  [97.5 F (36.4 C)-98.3 F (36.8 C)] 97.6 F (36.4 C) (06/10 0531) Pulse Rate:  [67-78] 68 (06/10 0531) Resp:  [16-20] 20 (06/10 0531) BP: (109-128)/(72-82) 123/82 mmHg (06/10 0531) SpO2:  [95 %-98 %] 96 % (06/10 0531) Weight:  [147 lb 11.3 oz (67 kg)] 147 lb 11.3 oz (67 kg) (06/10 0531) Last BM Date: 04/05/14  Filed Weights   04/04/14 0608 04/05/14 0500 04/06/14 0531  Weight: 154 lb 6.4 oz (70.035 kg) 148 lb 5.9 oz (67.3 kg) 147 lb 11.3 oz (67 kg)    Intake/Output Summary (Last 24 hours) at 04/06/14 0958 Last data filed at 04/06/14 0535  Gross per 24 hour  Intake    726 ml  Output   1425 ml  Net   -699 ml    Exam:  General: Chronically ill-appearing, in no distress.  Lungs: Coarse breath sounds, nonlabored.  Cardiac: Irregularly irregular, no gallop. 2/6 apical systolic murmur.  Extremities: Chronic appearing edema, improving.   Lab Results:  Basic Metabolic Panel:  Recent Labs Lab 04/04/14 0421 04/05/14 0558 04/06/14 0538  NA 144 142 139  K 3.4* 3.8 3.3*  CL 97 100 95*  CO2 33* 32 33*  GLUCOSE 89 109* 96  BUN 22 24* 21  CREATININE 0.79 0.83 0.72  CALCIUM 9.5 9.3 9.2    Liver Function Tests:  Recent Labs Lab 04/03/14 1524 04/04/14 0421  AST 18 19  ALT 9 7  ALKPHOS 97 93  BILITOT 1.9* 2.0*  PROT 7.0 6.6  ALBUMIN 3.2* 3.1*    CBC:  Recent Labs Lab 04/04/14 0421 04/05/14 0558 04/06/14 0538  WBC 5.3 7.6 7.0  HGB 12.5 12.9 12.6  HCT 40.5 40.6 39.8  MCV 92.0 91.9 91.3  PLT 232 219 212    Cardiac Enzymes:  Recent Labs Lab 04/03/14 2120 04/04/14 0421 04/04/14 1018  TROPONINI <0.30 <0.30 <0.30    BNP:  Recent Labs  04/03/14 1524 04/05/14 0558 04/06/14 0538  PROBNP 7099.0* 2771.0* 2663.0*    Echocardiogram  (04/05/14)  Study Conclusions  - Left ventricle: The cavity size was normal. Wall thickness was increased in a pattern of moderate LVH. Systolic function was normal. The estimated ejection fraction was in the range of 55% to 60%. Wall motion was normal; there were no regional wall motion abnormalities. The study is not technically sufficient to allow evaluation of LV diastolic function. Doppler parameters are consistent with high ventricular filling pressure. - Ventricular septum: The contour showed diastolic flattening and systolic flattening. - Aortic valve: Trileaflet; mildly calcified leaflets. There was mild regurgitation. Peak gradient (S): 15 mm Hg. - Mitral valve: There was mild to moderate regurgitation directed posteriorly. Mean gradient (D): 1 mm Hg. - Left atrium: The atrium was severely dilated. - Right ventricle: The cavity size was severely dilated. Systolic function was mildly to moderately reduced. - Right atrium: The atrium was severely dilated. Central venous pressure (est): 15 mm Hg. - Atrial septum: There was an apparent patent foramen ovale. Left to right flow visualized via color Doppler. - Tricuspid valve: There was moderate regurgitation. - Pulmonic valve: Peak gradient (S): 16 mm Hg. - Pulmonary arteries: Systolic pressure was severely increased. PA peak pressure: 68 mm Hg (S). - Pericardium, extracardiac: A trivial pericardial effusion was identified.  Impressions:  -  Moderate LVH with normal LV chamber size and LVEF 55-60%. Indeterminate diastolic function with evidence of increased filling pressures. There is diastolic and systolic septal flattening consistent with RV pressure and volume overload. Severe biatrial enlargement. Mild to moderate, posteriorly directed mitral regurgitation. Sclerotic aortic valve with overall mild aortic regurgitation. Severely dilated RV with mildly to moderately reduced contraction. There is evidence of severe pulmonary  hypertension with moderate tricuspid regurgitation and PASP 68 mmHg, elevated CVP as well. Trivial pericardial effusion noted.   Medications:   Scheduled Medications: . aspirin EC  325 mg Oral Daily  . atenolol  50 mg Oral Daily  . clotrimazole  1 Applicatorful Vaginal QHS  . digoxin  0.125 mg Oral Daily  . docusate sodium  100 mg Oral BID  . donepezil  10 mg Oral QHS  . DULoxetine  30 mg Oral Daily  . folic acid  1 mg Oral Daily  . furosemide  40 mg Intravenous Daily  . heparin  5,000 Units Subcutaneous 3 times per day  . multivitamin with minerals  1 tablet Oral Daily  . nystatin   Topical TID  . pantoprazole  40 mg Oral Daily  . potassium chloride  20 mEq Oral Daily  . sodium chloride  3 mL Intravenous Q12H  . sodium chloride  3 mL Intravenous Q12H  . thiamine  100 mg Oral Daily     PRN Medications: sodium chloride, acetaminophen, acetaminophen, alum & mag hydroxide-simeth, bisacodyl, fluticasone, HYDROcodone-acetaminophen, ondansetron (ZOFRAN) IV, ondansetron, sodium chloride   Assessment:   1. Acute on chronic diastolic heart failure complicated by cor pulmonale and severe pulmonary hypertension with mitral and tricuspid regurgitation as detailed above. Patient was not on diuretic as an outpatient, is feeling better with IV diuresis. Weight is coming down.  2. Permanent atrial fibrillation managed with strategy of heart rate control. She was taken off anticoagulants in light of recurrent GI bleeding and recent diagnosis of rectal carcinoma.   3. History of recurrent GI bleeding and recent diagnosis of rectal carcinoma, pending treatment with radiation and chemotherapy. Reportedly surgery is not planned with fairly local disease.   4. Mild dementia.   5. Hypertension.   Plan/Discussion:    Continue IV diuretic today and convert to Lasix 40 mg oral daily tomorrow. Otherwise no change to cardiac regimen. Continue to hold off anticoagulation as already discussed. My  understanding is that she will begin treatments for rectal carcinoma at the [facility] on June 25. This is being coordinated by [facility] and Dr. Tressie Stalker (per discussion with patient caregiver Wilma).   Satira Sark, M.D., F.A.C.C.

## 2014-04-06 NOTE — Evaluation (Signed)
Physical Therapy Evaluation Patient Details Name: Tamara Mitchell MRN: [id number] DOB: 12-23-1933 Today's Date: 04/06/2014   History of Present Illness  Tamara Mitchell is a 80 y.o. female presents with shortness of breath. Patient states that she has been feeling this way for a week or so. She states that she has had no cough and no congestion. She has not had any chest pain. She has had edema of her legs however. She states there is some wheeze noted also. She has no syncope. She denies having any headaches. She states that she recently was diagnosed with rectal cancer. She had been stopped on her anticoagulation due to bleeding and now only takes an aspirin every day. She has had no fevers no hemoptysis. She has no abdominal pain noted. She does have a history of an irregular heart beat and has been on digoxin for this.  Clinical Impression  Patient presents to PT after MD referral for assessment of mobility skills.  Patient lives alone in a multi level house with a basement where her washer/dryer are located, and 2 steps to enter the door.  Patient does report her brother stays with her over night, and her neighbors are able to help a little bit.  Prior to her hospitalization, the patient was (I) with bed mobility skills and transfers, and amb in the home with a cane or as a furniture walker and outside the home with rollator.  Also, patient reports she was active during the day and was never in the home for very long, before she began getting SOB with activity.  During evaluation, the patient was (I) with bed mobility skills, min guard for transfers, and able to amb with use of RW and min guard for 20 prior to a seated rest break secondary to SOB.  Recommend continued PT while in the hospital to address strengthening, activity tolerance to improve functional mobility skills and transition to HHPT after discharge.  No DME recommended as patient has all personal equipment.     Follow Up Recommendations Home  health PT    Equipment Recommendations  None recommended by PT (Patient has all DME )       Precautions / Restrictions Precautions Precautions: Fall Precaution Comments: O2 use via [place] Restrictions Weight Bearing Restrictions: No      Mobility  Bed Mobility Overal bed mobility: Independent                Transfers Overall transfer level: Needs assistance Equipment used: Rolling walker (2 wheeled) Transfers: Sit to/from [facility] Sit to Stand: Min guard Stand pivot transfers: Min guard          Ambulation/Gait Ambulation/Gait assistance: Min guard Ambulation Distance (Feet): 20 Feet Assistive device: Rolling walker (2 wheeled) Gait Pattern/deviations: Decreased dorsiflexion - right;Decreased dorsiflexion - left   Gait velocity interpretation: Below normal speed for age/gender           Balance Overall balance assessment: Needs assistance Sitting-balance support: Feet supported Sitting balance-Leahy Scale: Good     Standing balance support: During functional activity;Bilateral upper extremity supported (On RW, with min guard for transfers/gait)                                 Pertinent Vitals/Pain No pain reported.     Home Living Family/patient expects to be discharged to:: Private residence Living Arrangements: Alone;Other relatives (Patient's brother stays over at night, however pt is  alone during the day) Available Help at Discharge: Family;Neighbor Type of Home: House Home Access: Stairs to enter Entrance Stairs-Rails: None Entrance Stairs-Number of Steps: 2 steps into house from [place]; patient reports she holds onto doorframe Home Layout: Two level [place];Toilet riser;Other (comment);Cane - single point Agricultural consultant)      Prior Function Level of Independence: Independent with assistive device(s)         Comments: Patient reports use of cane/furniture walker in the home,  and rollator outside the home             Extremity/Trunk Assessment               Lower Extremity Assessment: Generalized weakness;RLE deficits/detail;LLE deficits/detail RLE Deficits / Details: MMT hip 3+/5, knee 4/5 LLE Deficits / Details: MMT hip 4-/5, knee 4/5     Communication   Communication: No difficulties  Cognition Arousal/Alertness: Awake/alert Behavior During Therapy: WFL for tasks assessed/performed                                 Assessment/Plan    PT Assessment Patient needs continued PT services  PT Diagnosis Generalized weakness;Difficulty walking   PT Problem List Decreased strength;Decreased activity tolerance;Decreased mobility;Cardiopulmonary status limiting activity  PT Treatment Interventions Gait training;Neuromuscular re-education;Stair training;Functional mobility training;Therapeutic activities;Therapeutic exercise   PT Goals (Current goals can be found in the Care Plan section) Acute Rehab PT Goals PT Goal Formulation: With patient Time For Goal Achievement: 04/20/14 Potential to Achieve Goals: Good    Frequency Min 3X/week    End of Session Equipment Utilized During Treatment: Gait belt Activity Tolerance: Other (comment) (SOB after ambualtion) Patient left: in chair;with call bell/phone within reach;with chair alarm set           Time: 0822-0839 PT Time Calculation (min): 17 min   Charges:   PT Evaluation $Initial PT Evaluation Tier I: 1 Procedure      Zahari Xiang 04/06/2014, 8:50 AM

## 2014-04-06 NOTE — Evaluation (Signed)
Occupational Therapy Evaluation Patient Details Name: Tamara Mitchell MRN: [id number] DOB: December 12, 1933 Today's Date: 04/06/2014    History of Present Illness Tamara Mitchell is a 80 y.o. female presents with shortness of breath. Patient states that she has been feeling this way for a week or so. She states that she has had no cough and no congestion. She has not had any chest pain. She has had edema of her legs however. She states there is some wheeze noted also. She has no syncope. She denies having any headaches. She states that she recently was diagnosed with rectal cancer. She had been stopped on her anticoagulation due to bleeding and now only takes an aspirin every day. She has had no fevers no hemoptysis. She has no abdominal pain noted. She does have a history of an irregular heart beat and has been on digoxin for this.   Clinical Impression   Pt is presenting to acute OT with above situation.  She was at a modified independent level with ADLs at baseline, and evaluation and pt report reveal that pt presents at same modified independent level currently.  Pt has no concerns about d/c home.   Provided handout on energy conservation strategies for pt perusal.  Pt does not need further OT services at this time.     Follow Up Recommendations  No OT follow up    Equipment Recommendations  None recommended by OT    Recommendations for Other Services       Precautions / Restrictions Precautions Precautions: Fall Precaution Comments: O2 use via [place] Restrictions Weight Bearing Restrictions: No      Mobility Bed Mobility                Transfers            Balance                                  ADL Overall ADL's : Modified independent                                             Vision                     Perception     Praxis      Pertinent Vitals/Pain Pt denies pain     Hand Dominance Left   Extremity/Trunk Assessment  Upper Extremity Assessment Upper Extremity Assessment: Overall WFL for tasks assessed   Lower Extremity Assessment Lower Extremity Assessment: Defer to PT evaluation RLE Deficits / Details: MMT hip 3+/5, knee 4/5 LLE Deficits / Details: MMT hip 4-/5, knee 4/5       Communication Communication Communication: No difficulties   Cognition Arousal/Alertness: Awake/alert Behavior During Therapy: WFL for tasks assessed/performed Overall Cognitive Status: Within Functional Limits for tasks assessed                     General Comments       Exercises       Shoulder Instructions      Home Living Family/patient expects to be discharged to:: Private residence Living Arrangements: Alone;Other relatives Available Help at Discharge: Family;Neighbor Type of Home: House Home Access: Stairs to enter [employer] of Steps: 2 steps into house from [place]; patient reports she holds  onto doorframe Entrance Stairs-Rails: None Home Layout: Two level;Laundry or work area in basement;Able to live on main level with bedroom/bathroom Alternate Therapist, sports of Steps: 1 flight to basement where washer/dryer are located Alternate Level Stairs-Rails: Can reach both Bathroom Shower/Tub: Tub/shower unit;Walk-in shower (pt prefers walk-in shower, and states that shower is too small for seat)   Bathroom Toilet: Standard     Home Equipment: Grab bars - tub/shower;Toilet riser;Other (comment);Cane - single point;Bedside commode;Walker - 4 wheels          Prior Functioning/Environment Level of Independence: Independent with assistive device(s)        Comments: Patient reports use of cane/furniture walker in the home, and rollator outside the home    OT Diagnosis:     OT Problem List:     OT Treatment/Interventions:      OT Goals(Current goals can be found in the care plan section) Acute Rehab OT Goals Patient Stated Goal: no OT goals needed OT Goal Formulation: With  patient  OT Frequency:     Barriers to D/C:            Co-evaluation              End of Session    Activity Tolerance: Patient tolerated treatment well Patient left: in chair;with family/visitor present;with call bell/phone within reach;with chair alarm set   Time: 6237-6283 OT Time Calculation (min): 20 min Charges:  OT General Charges $OT Visit: 1 Procedure OT Evaluation $Initial OT Evaluation Tier I: 1 Procedure G-Codes:     Bea Graff, MS, OTR/L [phone]  04/06/2014, 10:08 AM

## 2014-04-06 NOTE — Progress Notes (Signed)
Note: This document was prepared with digital dictation and possible smart phrase technology. Any transcriptional errors that result from this process are unintentional.   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 Recently diagnosed with of cancer workup being done at [facility] and in [place] Admitted 04/03/14 with shortness of breath and found to be in mild CHF with elevated pro BNP    Past medical history-As per Problem list Chart reviewed as below- reviewed  Consultants:  Cardiology  Procedures:  Echo 04/05/14  Antibiotics:  none   Subjective  Spirited Wants to go home Tolerating diet well On nasal cannula   Objective    Interim History: None  Telemetry: None   Objective: Filed Vitals:   04/05/14 1518 04/05/14 2104 04/06/14 0531 04/06/14 1110  BP: 109/72 128/72 123/82 118/78  Pulse: 72 67 68 72  Temp: 98.3 F (36.8 C) 97.5 F (36.4 C) 97.6 F (36.4 C)   TempSrc: Oral Oral Oral   Resp: 20 16 20    Height:      Weight:   67 kg (147 lb 11.3 oz)   SpO2: 98% 95% 96% 98%    Intake/Output Summary (Last 24 hours) at 04/06/14 1151 Last data filed at 04/06/14 0535  Gross per 24 hour  Intake    480 ml  Output    775 ml  Net   -295 ml    Exam:  General: EOMI, NCAT Cardiovascular: P2-R5 grade 3/4 systolic murmur left second intercostal space Respiratory: Clinically clear Abdomen: Soft nontender nondistended no rebound or guarding Skin trace lower extremity edema Neuro grossly intact  Data Reviewed: Basic  Metabolic Panel:  Recent Labs Lab 04/03/14 1524 04/04/14 0421 04/05/14 0558 04/06/14 0538  NA 141 144 142 139  K 4.1 3.4* 3.8 3.3*  CL 100 97 100 95*  CO2 27 33* 32 33*  GLUCOSE 95 89 109* 96  BUN 26* 22 24* 21  CREATININE 0.70 0.79 0.83 0.72  CALCIUM 9.5 9.5 9.3 9.2   Liver Function Tests:  Recent Labs Lab 04/03/14 1524 04/04/14 0421  AST 18 19  ALT 9 7  ALKPHOS 97 93  BILITOT 1.9* 2.0*  PROT 7.0 6.6  ALBUMIN 3.2* 3.1*   No results found for this basename: LIPASE, AMYLASE,  in the last 168 hours No results found for this basename: AMMONIA,  in the last 168 hours CBC:  Recent Labs Lab 04/03/14 1524 04/04/14 0421 04/05/14 0558 04/06/14 0538  WBC 5.6 5.3 7.6 7.0  NEUTROABS 4.0  --   --   --   HGB 12.7 12.5 12.9 12.6  HCT 40.6 40.5 40.6 39.8  MCV 92.3 92.0 91.9 91.3  PLT 202 232 219 212   Cardiac Enzymes:  Recent Labs Lab 04/03/14 1524 04/03/14 2120 04/04/14 0421 04/04/14 1018  TROPONINI <0.30 <0.30 <0.30 <0.30   BNP: No components found with this basename: POCBNP,  CBG:  Recent Labs Lab 04/04/14 0737 04/05/14 0740 04/06/14 1103  GLUCAP 84 83 136*    No results found for this or any previous visit (from the past 240 hour(s)).   Studies:  All Imaging reviewed and is as per above notation   Scheduled Meds: . aspirin EC  325 mg Oral Daily  . atenolol  50 mg Oral Daily  . clotrimazole  1 Applicatorful Vaginal QHS  . digoxin  0.125 mg Oral Daily  . docusate sodium  100 mg Oral BID  . donepezil  10 mg Oral QHS  . DULoxetine  30 mg Oral Daily  . folic acid  1 mg Oral Daily  . furosemide  40 mg Intravenous Daily  . heparin  5,000 Units Subcutaneous 3 times per day  . multivitamin with minerals  1 tablet Oral Daily  . nystatin   Topical TID  . pantoprazole  40 mg Oral Daily  . potassium chloride  20 mEq Oral Daily  . sodium chloride  3 mL Intravenous Q12H  . sodium chloride  3 mL Intravenous Q12H  . thiamine  100 mg Oral Daily    Continuous Infusions:    Assessment/Plan:  #1 acute on chronic diastolic CHF exacerbation   Presented with shortness of breath consistent with CHF exacerbation. Pro BNP elevated at 7099 on admission. I/O -1.114 Weight down from admit 71.5 kg-67kg Cardiac enzymes negative x3.  2-D echo with EF of 55-60% with moderate LVH, right ventricular pressure and volume overload with severe biatrial enlargement. Evidence of severe pulmonary hypertension with moderate tricuspid and mitral regurgitation and a PASP 68 mmHg.  Initially was diuresed with IV Lasix but converted to oral 40 mg Lasix 6/11.   #2 hypertension  Stable. Continue atenolol 50 mg daily, Lasix.   #3 dementia/depression  Stable. Continue Aricept, Cymbalta.   #4 history of rectal cancer  Outpatient [facility] at [facility] From her recollection she has not had any chemotherapy that would've caused heart failure   #5 history of atrial fibrillation  Continue digoxin 0.125 for rate control in addition to atenolol. Aspirin. Per cardiology will not resume anticoagulation at this point with rectal carcinoma, history of recurrent GI bleed and impending radiation and chemotherapy.   Full none NO family present Likely d/c 1-2 days   Verneita Griffes, MD  [facility] Pager [phone] 04/06/2014, 11:51 AM    LOS: 3 days

## 2014-04-07 LAB — BASIC METABOLIC PANEL
BUN: 18 mg/dL (ref 6–23)
CHLORIDE: 98 meq/L (ref 96–112)
CO2: 32 mEq/L (ref 19–32)
CREATININE: 0.74 mg/dL (ref 0.50–1.10)
Calcium: 9.1 mg/dL (ref 8.4–10.5)
GFR calc Af Amer: >90 mL/min (ref >90)
GFR calc non Af Amer: 79 mL/min — ABNORMAL LOW (ref >90)
Glucose, Bld: 121 mg/dL — ABNORMAL HIGH (ref 70–99)
Potassium: 3.6 mEq/L — ABNORMAL LOW (ref 3.7–5.3)
Sodium: 140 mEq/L (ref 137–147)

## 2014-04-07 LAB — GLUCOSE, CAPILLARY: GLUCOSE-CAPILLARY: 120 mg/dL — AB (ref 70–99)

## 2014-04-07 MED ORDER — FUROSEMIDE 40 MG PO TABS: 40.0000 mg | ORAL_TABLET | Freq: Every day | ORAL | Status: DC

## 2014-04-07 NOTE — Discharge Summary (Signed)
Physician Discharge Summary  Tamara Mitchell YQM:[id number] DOB: December 17, 1933 DOA: 04/03/2014  PCP: Delphina Cahill, MD  Admit date: 04/03/2014 Discharge date: 04/07/2014  Time spent: 35 minutes  Recommendations for Outpatient Follow-up:  1. Patient will need basic metabolic panel as well as daily weights. Home health has been ordered to help facilitate this  2. PTOT ordered for home health coverage 3. Get basic metabolic panel in about one week 4. See primary care physician for further needs as outpatient 5. Note that no anticoagulation has been ordered on the aspirin 325 mg as she has rectal cancer which may have lead   She was 24 isDischarge Diagnoses:  Principal Problem:   CHF (congestive heart failure) Active Problems:   Essential hypertension, benign   Atrial fibrillation   Dementia   Depression   Acute on chronic diastolic heart failure   Cor pulmonale   Discharge Condition:  Good  Diet recommendation:  Heart healthy  Filed Weights   04/05/14 0500 04/06/14 0531 04/07/14 0500  Weight: 67.3 kg (148 lb 5.9 oz) 67 kg (147 lb 11.3 oz) 67 kg (147 lb 11.3 oz)    History of present illness:  80 y/o ?, known history chronic Afib on AC/digoxin, pulm htn, TIa?/depression/htn/Gerd/h/o Urinary incontinence, prior GI bleed-last colonoscopy = August 2014 large polyp [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  Recently diagnosed with of cancer workup being done at [facility] and in [place]  Admitted 04/03/14 with shortness of breath and found to be in mild CHF with elevated pro BNP    Hospital Course:   #1 acute on chronic diastolic CHF exacerbation  Presented with shortness of breath consistent with CHF exacerbation. Pro BNP elevated at 7099 on admission.  I/O -2.174  Weight down from admit 71.5 kg-67kg  Cardiac  enzymes negative x3.  2-D echo with EF of 55-60% with moderate LVH, right ventricular pressure and volume overload with severe biatrial enlargement. Evidence of severe pulmonary hypertension with moderate tricuspid and mitral regurgitation and a PASP 68 mmHg.  Initially was diuresed with IV Lasix but converted to oral 40 mg Lasix 6/11. Will need home health therapy as well as nursing at home to help with heart failure and mobility  #2 hypertension  Stable. Continue atenolol 50 mg daily, Lasix. 40 mg daily #3 dementia/depression  Stable. Continue Aricept, Cymbalta.  #4 history of rectal cancer  Outpatient follow-up at [facility]  From her recollection she has not had any chemotherapy that would've caused heart failure  She has an appointment scheduled for sometime in the next week or  #5 history of atrial fibrillation  Continue digoxin 0.125 for rate control in addition to atenolol. Aspirin. Per cardiology will not resume anticoagulation at this point with rectal carcinoma, history of recurrent GI bleed and impending radiation and chemotherapy.   Consultants:  Cardiology Procedures:  Echo 04/05/14 Antibiotics:  none  Discharge Exam: Filed Vitals:   04/07/14 0935  BP: 111/60  Pulse: 74  Temp:   Resp:     General:  alert pleasant oriented no apparent distress Cardiovascular:  S1-S2 no murmur rub or gallop Respiratory:  clinically clear extremities show no edema  Discharge Instructions You were cared for by a hospitalist during your hospital stay. If you have any questions about your discharge medications or the care you received while you were in the hospital  after you are discharged, you can call the unit and asked to speak with the hospitalist on call if the hospitalist that took care of you is not available. Once you are discharged, your primary care physician will handle any further medical issues. Please note that NO REFILLS for any discharge medications will be authorized once you  are discharged, as it is imperative that you return to your primary care physician (or establish a relationship with a primary care physician if you do not have one) for your aftercare needs so that they can reassess your need for medications and monitor your lab values.  Discharge Instructions   Diet - low sodium heart healthy    Complete by:  As directed      Discharge instructions    Complete by:  As directed   You shouldn't take coumadin or blood thinnners other than aspirin 325 mg until discussed with the oncologist at [facility] Please continue to take Lasix 40 mg everyday and measure your weight with the same weighing scale. Please also eat a low-salt diet as much as possible. We will get therapy as well as home health to come out to see you and help her at home     Increase activity slowly    Complete by:  As directed             Medication List         aspirin 325 MG EC tablet  Take 325 mg by mouth daily.     atenolol 50 MG tablet  Commonly known as:  TENORMIN  Take 50 mg by mouth daily.     digoxin 0.125 MG tablet  Commonly known as:  LANOXIN  Take 1 tablet (0.125 mg total) by mouth daily.     donepezil 10 MG tablet  Commonly known as:  ARICEPT  Take 10 mg by mouth at bedtime.     DULoxetine 30 MG capsule  Commonly known as:  CYMBALTA  Take 30 mg by mouth daily.     fluticasone 50 MCG/ACT nasal spray  Commonly known as:  FLONASE  Place 2 sprays into the nose daily as needed for allergies.     furosemide 40 MG tablet  Commonly known as:  LASIX  Take 1 tablet (40 mg total) by mouth daily.     pantoprazole 40 MG tablet  Commonly known as:  PROTONIX  Take 1 tablet (40 mg total) by mouth daily.     promethazine 12.5 MG tablet  Commonly known as:  PHENERGAN  Take 12.5 mg by mouth every 4 (four) hours as needed for nausea or vomiting.       No Known Allergies    The results of significant diagnostics from this hospitalization (including imaging,  microbiology, ancillary and laboratory) are listed below for reference.    Significant Diagnostic Studies: Dg Chest 2 View  04/03/2014   CLINICAL DATA:  Shortness of breath.  EXAM: CHEST  2 VIEW  COMPARISON:  01/04/2014 and 12/24/2013  FINDINGS: Lungs are adequately inflated without focal consolidation or effusion. There is minimal the right per mediastinal density unchanged likely due in part to patient's rotation towards the right. There is moderate to severe stable cardiomegaly. Remainder the exam is unchanged.  IMPRESSION: Moderate to severe stable cardiomegaly. No acute cardiopulmonary disease.   Electronically Signed   By: Marin Olp M.D.   On: 04/03/2014 15:57   Ct Chest W Contrast  03/14/2014   CLINICAL DATA:  Rectal cancer.  EXAM: CT  CHEST, ABDOMEN, AND PELVIS WITH CONTRAST  TECHNIQUE: Multidetector CT imaging of the chest, abdomen and pelvis was performed following the standard protocol during bolus administration of intravenous contrast.  CONTRAST:  196mL OMNIPAQUE IOHEXOL 300 MG/ML SOLN, 1 OMNIPAQUE IOHEXOL 300 MG/ML SOLN  COMPARISON:  None.  FINDINGS: CT CHEST FINDINGS  The chest wall demonstrates borderline axillary and subpectoral lymph nodes. No breast masses are identified. The thyroid gland is normal.  The bony thorax is intact. No destructive bone lesions or spinal canal compromise. Significant scoliosis and degenerative changes.  The heart is markedly enlarged. No pericardial effusion. The aorta is normal in caliber. No dissection. The pulmonary arteries are markedly enlarged. This could be due to pulmonary hypertension. Scattered coronary artery calcifications are noted. No mediastinal or hilar mass or adenopathy.  Examination of the lung parenchyma demonstrates no acute pulmonary findings. There are a few small scattered pulmonary nodules which are indeterminate but likely benign. A 3 mm lesion is noted in the left upper lobe on image number 13. A 5 mm lesion is noted in the right  upper lobe on image number 24. Bilobed lesion in the right middle lobe on image number 29 measures 7.5 mm. No pleural effusion.  CT ABDOMEN AND PELVIS FINDINGS  Diffuse and fairly marked fatty infiltration of the liver but no focal hepatic lesions to suggest metastatic disease. The gallbladder is surgically absent. No common bile duct dilatation. The pancreas is markedly atrophied. No pancreatic mass or inflammatory changes. The spleen is very irregular and demonstrates central calcification. Suspect previous trauma. 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  The adrenal glands and kidneys are unremarkable. No worrisome lesions. A small cyst is noted on the left. No hydronephrosis.  The stomach, duodenum, small bowel and colon are grossly normal. There does appear to be mild diffuse rectal wall thickening but no discrete mass. The uterus and ovaries are grossly normal. No pelvic mass or adenopathy. No free pelvic fluid collections. No mesenteric or retroperitoneal mass or adenopathy. Small scattered lymph nodes are noted. The aorta demonstrates scattered atherosclerotic calcifications but no aneurysm or dissection. The branch vessels are patent.  The bony structures are intact.  No destructive bone lesions.  IMPRESSION: 1. Small scattered pulmonary nodules are indeterminate but likely benign. Followup CT imaging is suggested in 3 months. 2. Cardiac enlargement and pulmonary artery enlargement. 3. Rectal wall thickening but no discrete mass. No pelvic or abdominal metastatic disease is identified. 4. Diffuse fatty infiltration of the liver. 5. 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   On: 03/14/2014 12:50   Ct Angio Chest W/cm &/or Wo Cm  04/03/2014   CLINICAL DATA:  Shortness of breath. Atrial fibrillation. Recently diagnosed with colon carcinoma and taken off anticoagulation.  Clinical suspicion for pulmonary embolism.  EXAM: CT ANGIOGRAPHY CHEST WITH CONTRAST  TECHNIQUE: Multidetector CT imaging of the chest was performed using the standard protocol during bolus administration of intravenous contrast. Multiplanar CT image reconstructions and MIPs were obtained to evaluate the vascular anatomy.  CONTRAST:  147mL OMNIPAQUE IOHEXOL 350 MG/ML SOLN  COMPARISON:  03/14/2014  FINDINGS: Satisfactory opacification of pulmonary arteries noted, and no pulmonary emboli identified. No evidence of thoracic aortic dissection or aneurysm. No evidence of mediastinal hematoma or mass.  Marked cardiomegaly is stable and enlarged central pulmonary arteries remain suspicious for pulmonary arterial hypertension. Reflux of contrast into the IVC  and hepatic veins is consistent with right heart insufficiency.  New small right pleural effusion is seen. Mild diffuse granular pulmonary opacity and interstitial prominence is suspicious for mild interstitial edema. No evidence of pulmonary consolidation. Scattered tiny sub-cm bilateral pulmonary nodules remain stable. Early pulmonary metastases cannot definitely be excluded in the setting of colon cancer.  Review of the MIP images confirms the above findings.  IMPRESSION: No evidence of pulmonary embolism.  New small right pleural effusion and probable mild interstitial edema, suspicious for mild congestive heart failure.  Stable cardiomegaly and enlarged central pulmonary arteries, highly suspicious for pulmonary arterial hypertension. Also demonstrated is reflux of contrast into the IVC and hepatic veins, consistent with right heart insufficiency.  Stable tiny bilateral sub-cm pulmonary nodules. Early pulmonary metastases cannot be excluded in the setting of colon cancer. Continued followup by chest CT recommended in 3-4 months.   Electronically Signed   By: Earle Gell M.D.   On: 04/03/2014 17:18   Ct Abdomen Pelvis W Contrast  03/14/2014   CLINICAL DATA:   Rectal cancer.  EXAM: CT CHEST, ABDOMEN, AND PELVIS WITH CONTRAST  TECHNIQUE: Multidetector CT imaging of the chest, abdomen and pelvis was performed following the standard protocol during bolus administration of intravenous contrast.  CONTRAST:  181mL OMNIPAQUE IOHEXOL 300 MG/ML SOLN, 1 OMNIPAQUE IOHEXOL 300 MG/ML SOLN  COMPARISON:  None.  FINDINGS: CT CHEST FINDINGS  The chest wall demonstrates borderline axillary and subpectoral lymph nodes. No breast masses are identified. The thyroid gland is normal.  The bony thorax is intact. No destructive bone lesions or spinal canal compromise. Significant scoliosis and degenerative changes.  The heart is markedly enlarged. No pericardial effusion. The aorta is normal in caliber. No dissection. The pulmonary arteries are markedly enlarged. This could be due to pulmonary hypertension. Scattered coronary artery calcifications are noted. No mediastinal or hilar mass or adenopathy.  Examination of the lung parenchyma demonstrates no acute pulmonary findings. There are a few small scattered pulmonary nodules which are indeterminate but likely benign. A 3 mm lesion is noted in the left upper lobe on image number 13. A 5 mm lesion is noted in the right upper lobe on image number 24. Bilobed lesion in the right middle lobe on image number 29 measures 7.5 mm. No pleural effusion.  CT ABDOMEN AND PELVIS FINDINGS  Diffuse and fairly marked fatty infiltration of the liver but no focal hepatic lesions to suggest metastatic disease. The gallbladder is surgically absent. No common bile duct dilatation. The pancreas is markedly atrophied. No pancreatic mass or inflammatory changes. The spleen is very irregular and demonstrates central calcification. Suspect previous trauma. 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  The adrenal glands and kidneys are unremarkable. No worrisome lesions. A small cyst is noted on the left. No hydronephrosis.  The stomach,  duodenum, small bowel and colon are grossly normal. There does appear to be mild diffuse rectal wall thickening but no discrete mass. The uterus and ovaries are grossly normal. No pelvic mass or adenopathy. No free pelvic fluid collections. No mesenteric or retroperitoneal mass or adenopathy. Small scattered lymph nodes are noted. The aorta demonstrates scattered atherosclerotic calcifications but no aneurysm or dissection. The branch vessels are patent.  The bony structures are intact.  No destructive bone lesions.  IMPRESSION: 1. Small scattered pulmonary nodules are indeterminate but likely benign. Followup CT imaging is suggested in 3 months. 2. Cardiac enlargement and pulmonary artery enlargement. 3. Rectal wall thickening but no  discrete mass. No pelvic or abdominal metastatic disease is identified. 4. Diffuse fatty infiltration of the liver. 5. 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   On: 03/14/2014 12:50    Microbiology: No results found for this or any previous visit (from the past 240 hour(s)).   Labs: Basic Metabolic Panel:  Recent Labs Lab 04/03/14 1524 04/04/14 0421 04/05/14 0558 04/06/14 0538 04/07/14 0546  NA 141 144 142 139 140  K 4.1 3.4* 3.8 3.3* 3.6*  CL 100 97 100 95* 98  CO2 27 33* 32 33* 32  GLUCOSE 95 89 109* 96 121*  BUN 26* 22 24* 21 18  CREATININE 0.70 0.79 0.83 0.72 0.74  CALCIUM 9.5 9.5 9.3 9.2 9.1   Liver Function Tests:  Recent Labs Lab 04/03/14 1524 04/04/14 0421  AST 18 19  ALT 9 7  ALKPHOS 97 93  BILITOT 1.9* 2.0*  PROT 7.0 6.6  ALBUMIN 3.2* 3.1*   No results found for this basename: LIPASE, AMYLASE,  in the last 168 hours No results found for this basename: AMMONIA,  in the last 168 hours CBC:  Recent Labs Lab 04/03/14 1524 04/04/14 0421 04/05/14 0558 04/06/14 0538 04/06/14 1237  WBC 5.6 5.3 7.6 7.0 7.4  NEUTROABS 4.0  --   --   --   --    HGB 12.7 12.5 12.9 12.6 13.8  HCT 40.6 40.5 40.6 39.8 43.2  MCV 92.3 92.0 91.9 91.3 91.9  PLT 202 232 219 212 213   Cardiac Enzymes:  Recent Labs Lab 04/03/14 1524 04/03/14 2120 04/04/14 0421 04/04/14 1018  TROPONINI <0.30 <0.30 <0.30 <0.30   BNP: BNP (last 3 results)  Recent Labs  04/03/14 1524 04/05/14 0558 04/06/14 0538  PROBNP 7099.0* 2771.0* 2663.0*   CBG:  Recent Labs Lab 04/04/14 0737 04/05/14 0740 04/06/14 1103 04/07/14 0744  GLUCAP 84 83 136* 120*       Signed:  Nita Sells  [facility] 04/07/2014, 11:57 AM    Lithium and was 12 for

## 2014-04-07 NOTE — Progress Notes (Signed)
Primary cardiologist: Dr. Pixie Casino  Consulting cardiologist: Dr. Satira Sark  Subjective:   Patient comfortable this morning, no chest pain or shortness of breath.   Objective:   Temp:  [97.3 F (36.3 C)-98 F (36.7 C)] 97.9 F (36.6 C) (06/11 6712) Pulse Rate:  [60-74] 74 (06/11 0935) Resp:  [18-20] 18 (06/11 4580) BP: (102-118)/(54-78) 111/60 mmHg (06/11 0935) SpO2:  [88 %-98 %] 95 % (06/11 0937) Weight:  [147 lb 11.3 oz (67 kg)] 147 lb 11.3 oz (67 kg) (06/11 0500) Last BM Date: 04/05/14  Filed Weights   04/05/14 0500 04/06/14 0531 04/07/14 0500  Weight: 148 lb 5.9 oz (67.3 kg) 147 lb 11.3 oz (67 kg) 147 lb 11.3 oz (67 kg)    Intake/Output Summary (Last 24 hours) at 04/07/14 1036 Last data filed at 04/07/14 1010  Gross per 24 hour  Intake    240 ml  Output   1300 ml  Net  -1060 ml    Exam:  General: Chronically ill-appearing, in no distress.  Lungs: Coarse breath sounds, nonlabored.  Cardiac: Irregularly irregular, no gallop. 2/6 apical systolic murmur.  Extremities: Chronic appearing edema, improving.   Lab Results:  Basic Metabolic Panel:  Recent Labs Lab 04/05/14 0558 04/06/14 0538 04/07/14 0546  NA 142 139 140  K 3.8 3.3* 3.6*  CL 100 95* 98  CO2 32 33* 32  GLUCOSE 109* 96 121*  BUN 24* 21 18  CREATININE 0.83 0.72 0.74  CALCIUM 9.3 9.2 9.1    Liver Function Tests:  Recent Labs Lab 04/03/14 1524 04/04/14 0421  AST 18 19  ALT 9 7  ALKPHOS 97 93  BILITOT 1.9* 2.0*  PROT 7.0 6.6  ALBUMIN 3.2* 3.1*    CBC:  Recent Labs Lab 04/05/14 0558 04/06/14 0538 04/06/14 1237  WBC 7.6 7.0 7.4  HGB 12.9 12.6 13.8  HCT 40.6 39.8 43.2  MCV 91.9 91.3 91.9  PLT 219 212 213    Cardiac Enzymes:  Recent Labs Lab 04/03/14 2120 04/04/14 0421 04/04/14 1018  TROPONINI <0.30 <0.30 <0.30    Echocardiogram (04/05/14)  Study Conclusions  - Left ventricle: The cavity size was normal. Wall thickness was increased in a  pattern of moderate LVH. Systolic function was normal. The estimated ejection fraction was in the range of 55% to 60%. Wall motion was normal; there were no regional wall motion abnormalities. The study is not technically sufficient to allow evaluation of LV diastolic function. Doppler parameters are consistent with high ventricular filling pressure. - Ventricular septum: The contour showed diastolic flattening and systolic flattening. - Aortic valve: Trileaflet; mildly calcified leaflets. There was mild regurgitation. Peak gradient (S): 15 mm Hg. - Mitral valve: There was mild to moderate regurgitation directed posteriorly. Mean gradient (D): 1 mm Hg. - Left atrium: The atrium was severely dilated. - Right ventricle: The cavity size was severely dilated. Systolic function was mildly to moderately reduced. - Right atrium: The atrium was severely dilated. Central venous pressure (est): 15 mm Hg. - Atrial septum: There was an apparent patent foramen ovale. Left to right flow visualized via color Doppler. - Tricuspid valve: There was moderate regurgitation. - Pulmonic valve: Peak gradient (S): 16 mm Hg. - Pulmonary arteries: Systolic pressure was severely increased. PA peak pressure: 68 mm Hg (S). - Pericardium, extracardiac: A trivial pericardial effusion was identified.  Impressions:  - Moderate LVH with normal LV chamber size and LVEF 55-60%. Indeterminate diastolic function with evidence of increased filling pressures. There  is diastolic and systolic septal flattening consistent with RV pressure and volume overload. Severe biatrial enlargement. Mild to moderate, posteriorly directed mitral regurgitation. Sclerotic aortic valve with overall mild aortic regurgitation. Severely dilated RV with mildly to moderately reduced contraction. There is evidence of severe pulmonary hypertension with moderate tricuspid regurgitation and PASP 68 mmHg, elevated CVP as well. Trivial pericardial  effusion noted.   Medications:   Scheduled Medications: . aspirin EC  325 mg Oral Daily  . atenolol  50 mg Oral Daily  . clotrimazole  1 Applicatorful Vaginal QHS  . digoxin  0.125 mg Oral Daily  . docusate sodium  100 mg Oral BID  . donepezil  10 mg Oral QHS  . DULoxetine  30 mg Oral Daily  . folic acid  1 mg Oral Daily  . furosemide  40 mg Intravenous Daily  . furosemide  40 mg Oral Daily  . heparin  5,000 Units Subcutaneous 3 times per day  . multivitamin with minerals  1 tablet Oral Daily  . nystatin   Topical TID  . pantoprazole  40 mg Oral Daily  . potassium chloride  20 mEq Oral Daily  . sodium chloride  3 mL Intravenous Q12H  . sodium chloride  3 mL Intravenous Q12H  . thiamine  100 mg Oral Daily     PRN Medications: sodium chloride, acetaminophen, acetaminophen, alum & mag hydroxide-simeth, bisacodyl, fluticasone, HYDROcodone-acetaminophen, ondansetron (ZOFRAN) IV, ondansetron, sodium chloride   Assessment:   1. Acute on chronic diastolic heart failure complicated by cor pulmonale and severe pulmonary hypertension with mitral and tricuspid regurgitation as detailed above. Patient was not on diuretic as an outpatient, is feeling better with IV diuresis. Weight is coming down.  2. Permanent atrial fibrillation managed with strategy of heart rate control. She was taken off anticoagulants in light of recurrent GI bleeding and recent diagnosis of rectal carcinoma.   3. History of recurrent GI bleeding and recent diagnosis of rectal carcinoma, pending treatment with radiation and chemotherapy. Reportedly surgery is not planned with fairly local disease.   4. Mild dementia.   5. Hypertension.   Plan/Discussion:    Convert to oral Lasix today and continue cardiac regimen. Continue to hold off anticoagulation as already discussed. My understanding is that she will begin treatments for rectal carcinoma at the [facility] on June 25. This is being coordinated by  [facility] and Dr. Tressie Stalker. Chart review finds that case management is involved in patient disposition. No further cardiac evaluation planned as an inpatient now.   Satira Sark, M.D., F.A.C.C.

## 2014-04-07 NOTE — Progress Notes (Addendum)
AVS reviewed with patient and patient's caregiver.  Verbalized understanding of discharge instructions.  Both verbalized understanding of discharge instructions, physician follow-up and medications.  Both provided with heart failure education.  Home health to provide PT services at discharge.  Verbalized understanding.  Patient states that she does have a set of scales at home and verbalizes understanding on daily weights.  Patient's IV removed.  Site WNL.  Patient transported by NT via w/c to main entrance for discharge.  Patient stable at time of discharge.

## 2014-05-06 ENCOUNTER — Encounter: Payer: Self-pay | Admitting: Cardiology

## 2014-05-06 ENCOUNTER — Ambulatory Visit (INDEPENDENT_AMBULATORY_CARE_PROVIDER_SITE_OTHER): Payer: Medicare Other | Admitting: Cardiology

## 2014-05-06 VITALS — BP 100/60 | HR 61 | Ht 64.0 in | Wt 143.0 lb

## 2014-05-06 DIAGNOSIS — I279 Pulmonary heart disease, unspecified: Secondary | ICD-10-CM

## 2014-05-06 DIAGNOSIS — I2781 Cor pulmonale (chronic): Secondary | ICD-10-CM

## 2014-05-06 DIAGNOSIS — I5032 Chronic diastolic (congestive) heart failure: Secondary | ICD-10-CM

## 2014-05-06 DIAGNOSIS — I509 Heart failure, unspecified: Secondary | ICD-10-CM

## 2014-05-06 DIAGNOSIS — I482 Chronic atrial fibrillation, unspecified: Secondary | ICD-10-CM

## 2014-05-06 DIAGNOSIS — I4891 Unspecified atrial fibrillation: Secondary | ICD-10-CM

## 2014-05-06 MED ORDER — FUROSEMIDE 40 MG PO TABS: 40.0000 mg | ORAL_TABLET | Freq: Every day | ORAL | Status: DC

## 2014-05-06 NOTE — Progress Notes (Signed)
Clinical Summary Tamara Mitchell is a 80 y.o.female patient of Dr. Debara Pickett, now presenting to establish cardiology care in this office. She was seen in consultation at [facility] in June with acute on chronic diastolic heart failure complicated by cor pulmonale and severe pulmonary hypertension with mitral and tricuspid regurgitation. At that time she improved with IV diuresis. Anticoagulation was being held at that time in light of history of recurrent GI bleeding with rectal carcinoma and further treatment with radiation and chemotherapy pending. She tells me that she is halfway through her treatments now, followed in [facility] by Dr. Jacquiline Doe.  Lab work from June showed potassium 3.6, BUN 18, creatinine 0.7, hemoglobin 13.8, platelets 213.  Echocardiogram from June of this year showed moderate LVH with LVEF 55-60%, indeterminate diastolic function with increased filling pressures, mild to moderate mitral regurgitation, sclerotic aortic valve with mild aortic regurgitation, severely dilated RV with moderately reduced contraction, PASP 68 mmHg with elevated CVP, trivial pericardial effusion.  She reports intermittent sense of palpitations, but seems to have fairly good heart rate control of atrial fibrillation. She remains off aspirin or any anticoagulant treatments. Question will be whether she will be able to resume anticoagulation, largely depends on management of her rectal carcinoma and risk for recurrent GI bleeding.  Weight is down 4 pounds from June. I reinforced keeping track of daily weights at home. She reports compliance with Lasix.  No Known Allergies  Current Outpatient Prescriptions  Medication Sig Dispense Refill  . acetaminophen (TYLENOL) 500 MG tablet Take 500 mg by mouth every 6 (six) hours as needed.      Marland Kitchen atenolol (TENORMIN) 50 MG tablet Take 50 mg by mouth daily.      . capecitabine (XELODA) 500 MG tablet Take 1,000 mg/m2 by mouth 2 (two) times daily after a meal.      . digoxin  (LANOXIN) 0.125 MG tablet Take 0.0625 mg by mouth daily.      Marland Kitchen donepezil (ARICEPT) 10 MG tablet Take 10 mg by mouth at bedtime.      . DULoxetine (CYMBALTA) 30 MG capsule Take 30 mg by mouth daily.      . fluticasone (FLONASE) 50 MCG/ACT nasal spray Place 2 sprays into the nose daily as needed for allergies.       . furosemide (LASIX) 40 MG tablet Take 1 tablet (40 mg total) by mouth daily.  30 tablet  3  . pantoprazole (PROTONIX) 40 MG tablet Take 1 tablet (40 mg total) by mouth daily.  60 tablet  5   No current facility-administered medications for this visit.    Past Medical History  Diagnosis Date  . Essential hypertension, benign   . Permanent atrial fibrillation   . Mild dementia   . Mitral insufficiency     Mild to moderate  . Tricuspid insufficiency     Moderate  . Pulmonary hypertension     Severe with RV dysfunction  . Chronic venous insufficiency   . History of nuclear stress test 12/12/05/2010    Lexiscan; non-diagnostic for ischemia, low risk   . Depression   . GI bleed     Xarelto stopped  . 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 Helder reports that she has never smoked. She has never used smokeless tobacco. Ms. Naas reports that she does not drink alcohol.  Review of Systems No chest pain or shortness of breath at rest.  Stable appetite. Does not report any obvious melena or hematochezia. Uses a cane, no falls. Other systems reviewed and negative.  Physical Examination Filed Vitals:   05/06/14 1032  BP: 100/60  Pulse: 61   Filed Weights   05/06/14 1032  Weight: 143 lb (64.864 kg)    Patient appears comfortable at rest.  HEENT: Conjunctiva and lids normal, oropharynx clear.  Neck: Supple, elevated JVP, no carotid bruits, no thyromegaly.  Lungs: Decreased breath sounds , nonlabored breathing at rest.  Cardiac: Irregularly irregular, no S3, 2/6 apical systolic murmur, prominent S2,  no pericardial rub.  Abdomen: Soft, nontender, bowel sounds present, no guarding or rebound.  Extremities: Chronic appearing mild edema, distal pulses 2+.  Skin: Warm and dry.  Musculoskeletal: Mild kyphosis.  Neuropsychiatric: Alert and oriented x3, affect grossly appropriate.   Problem List and Plan   Atrial fibrillation Chronic, continue heart rate control strategy. She is off anticoagulation with history of recurrent GI bleeds and rectal carcinoma undergoing active treatment.  Chronic diastolic heart failure Symptomatically controlled at this time. Recent echocardiogram reviewed. Continue Lasix, daily weights. Followup [facility] for next visit in 3 months.  Cor pulmonale Severe RV dilatation with moderately reduced contraction and PASP 68 mmHg by recent echocardiogram.    Satira Sark, M.D., F.A.C.C.

## 2014-05-06 NOTE — Assessment & Plan Note (Signed)
Symptomatically controlled at this time. Recent echocardiogram reviewed. Continue Lasix, daily weights. Followup BMET for next visit in 3 months.

## 2014-05-06 NOTE — Patient Instructions (Signed)
Your physician recommends that you schedule a follow-up appointment in: 3 months with Dr. Domenic Polite  Your physician recommends that you continue on your current medications as directed. Please refer to the Current Medication list given to you today.  Your physician recommends that you return for labs before your next appointment. (BMP)  Thank you for choosing [facility]!!

## 2014-05-06 NOTE — Assessment & Plan Note (Signed)
Severe RV dilatation with moderately reduced contraction and PASP 68 mmHg by recent echocardiogram.

## 2014-05-06 NOTE — Assessment & Plan Note (Signed)
Chronic, continue heart rate control strategy. She is off anticoagulation with history of recurrent GI bleeds and rectal carcinoma undergoing active treatment.

## 2014-06-26 ENCOUNTER — Encounter (HOSPITAL_COMMUNITY): Payer: Self-pay | Admitting: Emergency Medicine

## 2014-06-26 ENCOUNTER — Emergency Department (HOSPITAL_COMMUNITY)
Admission: EM | Admit: 2014-06-26 | Discharge: 2014-06-26 | Disposition: A | Discharge status: Home / Self Care | Payer: Medicare Other | Attending: Emergency Medicine | Admitting: Emergency Medicine

## 2014-06-26 DIAGNOSIS — F0390 Unspecified dementia without behavioral disturbance: Secondary | ICD-10-CM | POA: Insufficient documentation

## 2014-06-26 DIAGNOSIS — F329 Major depressive disorder, single episode, unspecified: Secondary | ICD-10-CM | POA: Diagnosis not present

## 2014-06-26 DIAGNOSIS — S8990XA Unspecified injury of unspecified lower leg, initial encounter: Secondary | ICD-10-CM | POA: Diagnosis not present

## 2014-06-26 DIAGNOSIS — S85901A Unspecified injury of unspecified blood vessel at lower leg level, right leg, initial encounter: Secondary | ICD-10-CM

## 2014-06-26 DIAGNOSIS — I10 Essential (primary) hypertension: Secondary | ICD-10-CM | POA: Diagnosis not present

## 2014-06-26 DIAGNOSIS — Z9889 Other specified postprocedural states: Secondary | ICD-10-CM | POA: Diagnosis not present

## 2014-06-26 DIAGNOSIS — IMO0002 Reserved for concepts with insufficient information to code with codable children: Secondary | ICD-10-CM | POA: Insufficient documentation

## 2014-06-26 DIAGNOSIS — Y929 Unspecified place or not applicable: Secondary | ICD-10-CM | POA: Diagnosis not present

## 2014-06-26 DIAGNOSIS — Y939 Activity, unspecified: Secondary | ICD-10-CM | POA: Diagnosis not present

## 2014-06-26 DIAGNOSIS — S99929A Unspecified injury of unspecified foot, initial encounter: Principal | ICD-10-CM

## 2014-06-26 DIAGNOSIS — I4891 Unspecified atrial fibrillation: Secondary | ICD-10-CM | POA: Insufficient documentation

## 2014-06-26 DIAGNOSIS — F3289 Other specified depressive episodes: Secondary | ICD-10-CM | POA: Insufficient documentation

## 2014-06-26 DIAGNOSIS — X58XXXA Exposure to other specified factors, initial encounter: Secondary | ICD-10-CM | POA: Insufficient documentation

## 2014-06-26 DIAGNOSIS — S99919A Unspecified injury of unspecified ankle, initial encounter: Principal | ICD-10-CM

## 2014-06-26 DIAGNOSIS — Z79899 Other long term (current) drug therapy: Secondary | ICD-10-CM | POA: Insufficient documentation

## 2014-06-26 DIAGNOSIS — Z8719 Personal history of other diseases of the digestive system: Secondary | ICD-10-CM | POA: Diagnosis not present

## 2014-06-26 MED ORDER — SILVER NITRATE-POT NITRATE 75-25 % EX MISC
CUTANEOUS | Status: AC
  Filled 2014-06-26: qty 1

## 2014-06-26 NOTE — ED Notes (Signed)
Pressure dressing applied and footies given

## 2014-06-26 NOTE — ED Provider Notes (Signed)
Patient seen/examined in the Emergency Department in conjunction with Midlevel Provider [facility] Patient reports bleeding to right ankle Exam : awake/alert, bleeding now controlled Plan: stable for d/c home   Sharyon Cable, MD 06/26/14 [id number]

## 2014-06-26 NOTE — ED Notes (Signed)
Removed bandage, cleaned all the dried blood i could from pt foot.  No further bleeding at this time

## 2014-06-26 NOTE — ED Notes (Signed)
Silver nitrate application by NP.

## 2014-06-26 NOTE — Discharge Instructions (Signed)
You had a broken blood vessel in to foot that caused the bleeding. We have cauterized the area to stop the bleeding. Wear the pressure dressing for the next 24 hours. Check your foot for circulation and if you have tingling in your toes or they feel cooler than the other foot loosen the dressing.  Return as needed.

## 2014-06-26 NOTE — ED Provider Notes (Signed)
CSN: [id number]     Arrival date & time 06/26/14  1729 History   First MD Initiated Contact with Patient 06/26/14 1812    This chart was scribed for non-physician practitioner Lily Kocher, PA-C working with Sharyon Cable, MD, by Thea Alken, ED Scribe. This patient was seen in room APA08/APA08 and the patient's care was started at [facility]. Chief Complaint  Patient presents with  . Foot Injury    [name] presenting with foot injury. The history is provided by the patient. No language interpreter was used.  Foot Injury  Tamara Mitchell is a 80 y.o. female brought in by ambulance who presents to the Emergency Department complaining of right foot injury. Pt was visiting a friend at [facility] when she looked down she noticed her foot was suddenly bleeding. Pt denies being in pain. Bleeding is controled at this time. Pt is taking lasix. Pt reports h/o colon cancer and CHF. Pt denies weakness and numbness.  Pt denies problems with circulation. Pt is UTD on TDAP.  Past Medical History  Diagnosis Date  . Essential hypertension, benign   . Permanent atrial fibrillation   . Mild dementia   . Mitral insufficiency     Mild to moderate  . Tricuspid insufficiency     Moderate  . Pulmonary hypertension     Severe with RV dysfunction  . Chronic venous insufficiency   . History of nuclear stress test 12/12/05/2010    Lexiscan; non-diagnostic for ischemia, low risk   . Depression   . GI bleed     Xarelto stopped  . 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 Cholecystectomy  2006  . Total knee arthroplasty Left 2013  . Tonsillectomy    . Colonoscopy with esophagogastroduodenoscopy (egd) N/A 06/22/2013    Procedure: COLONOSCOPY WITH ESOPHAGOGASTRODUODENOSCOPY (EGD);  Surgeon: Rogene Houston, MD;  Location: [facility];  Service: Endoscopy;  Laterality: N/A;  730  .  Transthoracic echocardiogram  10/24/2012    EF=50-55%, mod conc LVH; RV mod-severely dilated & systolic function mildly reduced; LA severely dialted, possible small PFO; mild mitral annular calcif & mild MR; mod TR & elevated RV systolic pressure, mod pulm HTN; AV mildyl sclerotic & mild-mod regurg (performed for murmur & afib)  . Total knee arthroplasty Right 12/07/2013  . Flexible sigmoidoscopy N/A 03/14/2014    Procedure: FLEXIBLE SIGMOIDOSCOPY;  Surgeon: Rogene Houston, MD;  Location: [facility];  Service: Endoscopy;  Laterality: N/A;  730  . Polypectomy N/A 03/14/2014    Procedure: POLYPECTOMY;  Surgeon: Rogene Houston, MD;  Location: [facility];  Service: Endoscopy;  Laterality: N/A;   Family History  Problem Relation Age of Onset  . Colon cancer Neg Hx    History  Substance Use Topics  . Smoking status: Never Smoker   . Smokeless tobacco: Never Used  . Alcohol Use: No   OB History   Grav Para Term Preterm Abortions TAB SAB Ect Mult Living                 Review of Systems  Neurological: Negative for dizziness, weakness, light-headedness and numbness.  Hematological: Negative for adenopathy. Does not bruise/bleed easily.  All other systems reviewed and are negative.  Allergies  Review of patient's allergies indicates no known allergies.  Home Medications   Prior to Admission medications  Medication Sig Start Date End Date Taking? Authorizing Provider  acetaminophen (TYLENOL) 500 MG tablet Take 500 mg by mouth every 6 (six) hours as needed for mild pain.    Yes Historical Provider, MD  atenolol (TENORMIN) 50 MG tablet Take 50 mg by mouth daily.   Yes Historical Provider, MD  digoxin (LANOXIN) 0.125 MG tablet Take 0.0625 mg by mouth daily.   Yes Historical Provider, MD  donepezil (ARICEPT) 10 MG tablet Take 10 mg by mouth at bedtime.   Yes Historical Provider, MD  DULoxetine (CYMBALTA) 30 MG capsule Take 30 mg by mouth daily.   Yes Historical Provider, MD   fluticasone (FLONASE) 50 MCG/ACT nasal spray Place 2 sprays into the nose daily as needed for allergies.    Yes Historical Provider, MD  furosemide (LASIX) 40 MG tablet Take 1 tablet (40 mg total) by mouth daily. 05/06/14  Yes Satira Sark, MD  pantoprazole (PROTONIX) 40 MG tablet Take 1 tablet (40 mg total) by mouth daily. 06/22/13  Yes Rogene Houston, MD  capecitabine (XELODA) 500 MG tablet Take 1,500 mg/m2 by mouth 2 (two) times daily after a meal. Takes for 2 weeks daily then stops for 2 weeks.    Historical Provider, MD   BP 109/59  Pulse 66  Temp(Src) 98.2 F (36.8 C) (Oral)  Resp 20  SpO2 91% Physical Exam  Nursing note and vitals reviewed. Constitutional: She is oriented to person, place, and time. She appears well-developed and well-nourished. No distress.  HENT:  Head: Normocephalic and atraumatic.  Eyes: Conjunctivae and EOM are normal.  Neck: Neck supple.  Cardiovascular: Normal rate.   Good pulses. Adequate circulation. Bleeding varicose vein  lateral aspect of right foot. Moderate bleeding.    Pulmonary/Chest: Effort normal.  Abdominal: Soft. Bowel sounds are normal.  No pulsating mass  Musculoskeletal: Normal range of motion. She exhibits no edema and no tenderness.  Neurological: She is alert and oriented to person, place, and time.  Skin: Skin is warm and dry.  Psychiatric: She has a normal mood and affect. Her behavior is normal.    ED Course  Procedures (including critical care time)  Area cauterized with silver nitrate and with good results bleeding stopped.  Dr. Christy Gentles has seen the pt. Dressing applied to area. Pt is UTD on TDAP.      Labs Review Labs Reviewed - No data to display  Imaging Review No results found.   EKG Interpretation None      MDM Nursing notes reviewed. Previous records reviewed. Pt not on anticoagulation meds.   Final diagnoses:  None   I have reviewed nursing notes, vital signs, and all appropriate lab and imaging  results for this patient. I personally performed the services described in this documentation, which was scribed in my presence. The recorded information has been reviewed and is accurate.      Lenox Ahr, PA-C 07/03/14 1621

## 2014-06-26 NOTE — ED Notes (Signed)
Pt was visiting a resident at [facility] when she suddenly realized that her right ankle was bleeding, per ems her shoe had a puddle of blood in it.  Staff at [facility] wrapped the area and EMS transported pt here.  Bleeding controlled at this time, pt is not aware of any trauma and nursing staff at [facility] center described it as "small area, maybe a popped varicose vein"

## 2014-06-30 ENCOUNTER — Telehealth: Payer: Self-pay | Admitting: *Deleted

## 2014-06-30 NOTE — Telephone Encounter (Signed)
Pt wants to know if she can cut furosemide down to half a day

## 2014-06-30 NOTE — Telephone Encounter (Signed)
Pt with diastolic heart failure,takes Lasix 40 mg daily.She wanted to reduce dose in half because she travels quite a bit and has to stop to urinate frequently. I suggested she take split tab and take half in the am and half later in the day.She agrees

## 2014-07-04 NOTE — ED Provider Notes (Signed)
Medical screening examination/treatment/procedure(s) were conducted as a shared visit with non-physician practitioner(s) and myself.  I personally evaluated the patient during the encounter.   EKG Interpretation None        Sharyon Cable, MD 07/04/14 [phone]

## 2014-07-11 ENCOUNTER — Inpatient Hospital Stay
Admission: RE | Admit: 2014-07-11 | Discharge: 2014-07-26 | Disposition: A | Discharge status: Home / Self Care | Payer: Medicare Other | Source: Ambulatory Visit | Attending: Internal Medicine | Admitting: Internal Medicine

## 2014-07-13 ENCOUNTER — Non-Acute Institutional Stay (SKILLED_NURSING_FACILITY): Payer: Medicare Other | Admitting: Internal Medicine

## 2014-07-13 DIAGNOSIS — I50810 Right heart failure, unspecified: Secondary | ICD-10-CM

## 2014-07-13 DIAGNOSIS — I4891 Unspecified atrial fibrillation: Secondary | ICD-10-CM

## 2014-07-13 DIAGNOSIS — I482 Chronic atrial fibrillation, unspecified: Secondary | ICD-10-CM

## 2014-07-13 DIAGNOSIS — C20 Malignant neoplasm of rectum: Secondary | ICD-10-CM | POA: Insufficient documentation

## 2014-07-13 DIAGNOSIS — R5383 Other fatigue: Secondary | ICD-10-CM

## 2014-07-13 DIAGNOSIS — I509 Heart failure, unspecified: Secondary | ICD-10-CM

## 2014-07-13 DIAGNOSIS — R5381 Other malaise: Secondary | ICD-10-CM

## 2014-07-13 DIAGNOSIS — R531 Weakness: Secondary | ICD-10-CM | POA: Insufficient documentation

## 2014-07-13 NOTE — Progress Notes (Signed)
Patient ID: Tamara Mitchell, female   DOB: February 13, 1934, 80 y.o.   MRN: [id number]  Facility; [facility] Chief complaint; admission to SNF post admit to [facility] from 9/11 to 9/14  History; this is a 80 year old woman who lives on her own in [place]. We actually had her in the facility in February after a right total knee replacement for rehabilitation. Since she was last here the patient was diagnosed with adenocarcinoma of the rectum and I believe has been followed at [facility]. She has completed a course of radiation and had been on Xeloda. She states that she over the last week to 2 had noted increasing weakness with exertion. SGOT to the point where she could not walk across a single room in her home without stopping due to exhaustion she was admitted to [facility]. She was felt to have a urinary tract infection and was started on Rocephin. Her urine cultures had no growth. She was also felt to be dehydrated admitted with a BUN of 19 creatinine of 0.64 a sodium of 136 calcium of 8.9 total bilirubin of 2.1 AST of 21 ALT of 13 alkaline phosphatase of 126 of brain natruretic peptide of 868 and an albumin of 3.3. CBC showed a white count of 3.9 hemoglobin of 10.7 noted macrocytic platelet count 170 differential count appeared normal urinalysis showed moderate blood moderate leukocyte esterase. She was hydrated and however developed congestive heart failure and she was given a single dose of Lasix she was sent here due to generalized frailty and a recommendation for physical therapy prior to attempting to return to her own home.  Past Medical History  Diagnosis Date  . Essential hypertension, benign   . Permanent atrial fibrillation   . Mild dementia   . Mitral insufficiency     Mild to moderate  . Tricuspid insufficiency     Moderate  . Pulmonary hypertension     Severe with RV dysfunction  . Chronic venous insufficiency   . History of nuclear stress test  12/12/05/2010    Lexiscan; non-diagnostic for ischemia, low risk   . Depression   . GI bleed     Xarelto stopped  . Rectal polyp     Tubulovillous adenoma with high-grade dysplasia but no evidence of invasion Adenocarcinoma of the rectum by biopsy June 2015    Past Surgical History  Procedure Laterality Date  . Cholecystectomy  2006  . Total knee arthroplasty Left 2013  . Tonsillectomy    . Colonoscopy with esophagogastroduodenoscopy (egd) N/A 06/22/2013    Procedure: COLONOSCOPY WITH ESOPHAGOGASTRODUODENOSCOPY (EGD);  Surgeon: Rogene Houston, MD;  [facility];  Service: Endoscopy;  Laterality: N/A;  730  . Transthoracic echocardiogram  10/24/2012    EF=50-55%, mod conc LVH; RV mod-severely dilated & systolic function mildly reduced; LA severely dialted, possible small PFO; mild mitral annular calcif & mild MR; mod TR & elevated RV systolic pressure, mod pulm HTN; AV mildyl sclerotic & mild-mod regurg (performed for murmur & afib)  . Total knee arthroplasty Right 12/07/2013  . Flexible sigmoidoscopy N/A 03/14/2014    Procedure: FLEXIBLE SIGMOIDOSCOPY;  Surgeon: Rogene Houston, MD;  [facility];  Service: Endoscopy;  Laterality: N/A;  730  . Polypectomy N/A 03/14/2014    Procedure: POLYPECTOMY;  Surgeon: Rogene Houston, MD;  [facility];  Service: Endoscopy;  Laterality: N/A;    Medications; Acetaminophen 500 mg every 4 when necessary Digoxin 0.5 mg daily  Aricept 10 mg at bedtime Duloxetine 30 mg daily Fluticasone 2 sprays daily Protonic 40 mg daily Atenolol 50 mg daily Ceftin 300 mg twice a day  Social; patient lives in her own home in [place]. She had recently been started on continuous oxygen. I am not quite clear of her functional status although the patient states before a week prior to this hospitalization she was independent at least with ADLs  family history is negative for Colon cancer.  Review of systems Respiratory; patient states she  has exertional shortness of breath but no cough Cardiac no chest pain Abdomen states she is having 3-4 loose bowel movements per day but not diarrhea. No hematochezia GU no dysuria  Physical examination Gen. frail-looking woman but alert certainly able to give her own history. No medical distress Vitals; O2 sat is 96% on room air although she may just have been off oxygen pulse 67 and irregular respirations 20 Respiratory; generally clear entry bilaterally Cardiac; heart sounds seem normal. S4. No S3. JVP is elevated to the angle of the jaw at 45 there is a tall T waves compatible with tricuspid regurgitation. There is a grade 3/6 pansystolic murmur at the lower left sternal border Abdomen; slightly distended but bowel sounds positive. No liver no spleen Extremities; history of bilateral total knee replacements. She has venous stasis mild edema. There is no focal lower extremity findings mild generalized weakness  Impression/plan #1 severe generalized weakness probably a combination of muscular weakness, respiratory issues. I am not clear whether this has improved or not to. According to the notes her urine culture was negative however she may have received antibiotics before the culture was obtained I am just not sure. She was discharged on Ceftin. I am skeptical that a UTI cause this although I just can't be certain. #2 history of tricuspid and mitral regurgitation [C. echocardiogram below] and severe pulmonary hypertension. She probably needs chronic oxygen. I wonder if her exercise capacity is limited mostly because of this. She may have had a left heart failure related to fluid management in the hospital. There is no evidence of this currently. Her right heart failure is evident on exam however right now this is mild and I don't think needs diuretic although she may require some at some point #3 on a high dose of digoxin level will need to be checked #4 atrial fibrillation. Heart rate is  controlled. At one point she was on anticoagulation however she is not on that currently presumably related to her rectal carcinoma #5 adenocarcinoma of the rectum. It looked care everywhere but was able to find that she had been on Xeloda. She does not that currently.  She has completed her radiation. Staging CT scans did not show any intra-abdominal metastasis however did show small pulmonary areas which were probably benign although a followup CT scan was at one point recommended. I am not sure whether this is has been done. I am not planning to order that here as she is following up with oncology in [place] #6 depression this seems stable she was really quite jovial with me. #7 mild dementia on Aricept the. She was on Aricept the last time she was in the building. In general conversation I am not really able to pick up her deficits.     ------------------------------------------------------------------- Transthoracic Echocardiography  Patient:    Tamara Mitchell, Tamara Mitchell MR #:       [id number] Study Date: 04/05/2014 Gender:     F Age:  80 Height:     162.6 cm Weight:     69.9 kg BSA:        1.79 m^2 Pt. Status: Room:       Baldwin, RDCS  ADMITTING    Devona Konig A  PERFORMING   Chmg, Forestine Na  cc:  ------------------------------------------------------------------- LV EF: 55% -   60%  ------------------------------------------------------------------- Indications:      CHF - 428.0.  ------------------------------------------------------------------- History:   PMH:   Atrial fibrillation.  Primary pulmonary hypertension.  Risk factors:  Dementia. Hypertension.  ------------------------------------------------------------------- Study Conclusions  - Left ventricle: The cavity size was normal. Wall thickness was   increased in a pattern  of moderate LVH. Systolic function was   normal. The estimated ejection fraction was in the range of 55%   to 60%. Wall motion was normal; there were no regional wall   motion abnormalities. The study is not technically sufficient to   allow evaluation of LV diastolic function. Doppler parameters are   consistent with high ventricular filling pressure. - Ventricular septum: The contour showed diastolic flattening and   systolic flattening. - Aortic valve: Trileaflet; mildly calcified leaflets. There was   mild regurgitation. Peak gradient (S): 15 mm Hg. - Mitral valve: There was mild to moderate regurgitation directed   posteriorly. Mean gradient (D): 1 mm Hg. - Left atrium: The atrium was severely dilated. - Right ventricle: The cavity size was severely dilated. Systolic   function was mildly to moderately reduced. - Right atrium: The atrium was severely dilated. Central venous   pressure (est): 15 mm Hg. - Atrial septum: There was an apparent patent foramen ovale. Left   to right flow visualized via color Doppler. - Tricuspid valve: There was moderate regurgitation. - Pulmonic valve: Peak gradient (S): 16 mm Hg. - Pulmonary arteries: Systolic pressure was severely increased. PA   peak pressure: 68 mm Hg (S). - Pericardium, extracardiac: A trivial pericardial effusion was   identified.  Impressions:  - Moderate LVH with normal LV chamber size and LVEF 55-60%.   Indeterminate diastolic function with evidence of increased   filling pressures. There is diastolic and systolic septal   flattening consistent with RV pressure and volume overload.   Severe biatrial enlargement. Mild to moderate, posteriorly   directed mitral regurgitation. Sclerotic aortic valve with   overall mild aortic regurgitation. Severely dilated RV with   mildly to moderately reduced contraction. There is evidence of   severe pulmonary hypertension with moderate tricuspid   regurgitation and PASP 68 mmHg,  elevated CVP as well. Trivial   pericardial effusion noted.  Transthoracic echocardiography.  M-mode, complete 2D, spectral Doppler, and color Doppler.  Birthdate:  Patient birthdate: 05-21-1934.  Age:  Patient is 80 yr old.  Sex:  Gender: female. Height:  Height: 162.6 cm. Height: 64 in.  Weight:  Weight: 69.9 kg. Weight: 153.7 lb.  Body mass index:  BMI: 26.4 kg/m^2.  Body surface area:    BSA: 1.79 m^2.  Blood pressure:     155/97 Patient status:  Inpatient.  Study date:  Study date: 04/05/2014. Study time: 09:06 AM.  Location:  Bedside.  -------------------------------------------------------------------  ------------------------------------------------------------------- Left ventricle:  The cavity size was normal. Wall thickness was increased in a pattern of moderate LVH. Systolic function was normal. The estimated ejection fraction was in  the range of 55% to 60%. Wall motion was normal; there were no regional wall motion abnormalities. The study is not technically sufficient to allow evaluation of LV diastolic function. Doppler parameters are consistent with high ventricular filling pressure.  ------------------------------------------------------------------- Aortic valve:   Trileaflet; mildly calcified leaflets. Cusp separation was normal.  Doppler:  There was mild regurgitation. Valve area (VTI): 1.85 cm^2. Indexed valve area (VTI): 1.03 cm^2/m^2. Valve area (Vmax): 1.72 cm^2. Indexed valve area (Vmax): 0.96 cm^2/m^2.    Peak gradient (S): 15 mm Hg.  ------------------------------------------------------------------- Aorta:  Aortic root: The aortic root was normal in size.  ------------------------------------------------------------------- Mitral valve:   Mildly thickened leaflets .  Doppler:  There was mild to moderate regurgitation directed posteriorly.    Valve area by continuity equation (using LVOT flow): 3.85 cm^2. Indexed valve area by continuity equation (using  LVOT flow): 2.15 cm^2/m^2. Mean gradient (D): 1 mm Hg. Peak gradient (D): 8 mm Hg.  ------------------------------------------------------------------- Left atrium:  The atrium was severely dilated.  ------------------------------------------------------------------- Atrial septum:  There was an apparent patent foramen ovale. Left to right flow visualized via color Doppler.  ------------------------------------------------------------------- Right ventricle:  The cavity size was severely dilated. Systolic function was mildly to moderately reduced.  ------------------------------------------------------------------- Ventricular septum:   The contour showed diastolic flattening and systolic flattening.  ------------------------------------------------------------------- Pulmonic valve:    The valve appears to be grossly normal. Doppler:  There was trivial regurgitation. Peak gradient (S): 16 mm Hg.  ------------------------------------------------------------------- Tricuspid valve:   The valve appears to be grossly normal. Doppler:  There was moderate regurgitation.  ------------------------------------------------------------------- Pulmonary artery:   Systolic pressure was severely increased.  ------------------------------------------------------------------- Right atrium:  The atrium was severely dilated.  ------------------------------------------------------------------- Pericardium:  A trivial pericardial effusion was identified.  ------------------------------------------------------------------- Systemic veins: Inferior vena cava: The vessel was dilated. The respirophasic diameter changes were blunted (< 50%), consistent with elevated central venous pressure.  ------------------------------------------------------------------- Prepared and Electronically Authenticated by  Rozann Lesches, M.D. 2015-06-09T12:57:34

## 2014-07-15 ENCOUNTER — Non-Acute Institutional Stay (SKILLED_NURSING_FACILITY): Payer: Medicare Other | Admitting: Internal Medicine

## 2014-07-15 DIAGNOSIS — I482 Chronic atrial fibrillation, unspecified: Secondary | ICD-10-CM

## 2014-07-15 DIAGNOSIS — I4891 Unspecified atrial fibrillation: Secondary | ICD-10-CM

## 2014-07-15 DIAGNOSIS — R609 Edema, unspecified: Secondary | ICD-10-CM

## 2014-07-15 NOTE — Progress Notes (Signed)
Patient ID: Tamara Mitchell, female   DOB: 12-Nov-1933, 80 y.o.   MRN: [id number]   Facility; [facility]   Chief complaint; acute visit secondary to question edema-   HPI--; this is a 80 year old woman who lives on her own in [place]. We actually had her in the facility in February after a right total knee replacement for rehabilitation. Since she was last here the patient was diagnosed with adenocarcinoma of the rectum . She has completed a course of radiation and had been on Xeloda.  Recently She  had noted increasing weakness with exertion.  to the point where she could not walk across a single room in her home without stopping due to exhaustion she was admitted to [facility]. She was felt to have a urinary tract infection and was started on Rocephin. Her urine cultures had no growth. She was also felt to be dehydrated admitted with a BUN of 19 creatinine of 0.64 a sodium of 136 calcium of 8.9 total bilirubin of 2.1 AST of 21 ALT of 13 alkaline phosphatase of 126 of brain natruretic peptide of 868 and an albumin of 3.3. CBC showed a white count of 3.9 hemoglobin of 10.7 noted macrocytic platelet count 170 differential count appeared normal urinalysis showed moderate blood moderate leukocyte esterase. She was hydrated and however developed congestive heart failure and she was given a single dose of Lasix she was sent here due to generalized frailty and a recommendation for physical therapy prior to attempting to return to her own home. A review of previous records appeared she had been on Lasix at some point-but was not discharged on this.  Nursing left a note about some possibly increased edema noted yesterday.  I did review her weight is which appear to be relatively stable possibly gain of about 2 pounds over the last several days-.  Patient herself denies any increased edema from her baseline state she has had her legs and somewhat dependent position recently-does not complaining of  any shortness of breath or chest pain she is ambulating about the facility her wheelchair --it appears without any difficulty.    Past Medical History   Diagnosis  Date   .  Essential hypertension, benign    .  Permanent atrial fibrillation    .  Mild dementia    .  Mitral insufficiency      Mild to moderate   .  Tricuspid insufficiency      Moderate   .  Pulmonary hypertension      Severe with RV dysfunction   .  Chronic venous insufficiency    .  History of nuclear stress test  12/12/05/2010     Lexiscan; non-diagnostic for ischemia, low risk   .  Depression    .  GI bleed      Xarelto stopped   .  Rectal polyp      Tubulovillous adenoma with high-grade dysplasia but no evidence of invasion  Adenocarcinoma of the rectum by biopsy June 2015    Past Surgical History   Procedure  Laterality  Date   .  Cholecystectomy   2006   .  Total knee arthroplasty  Left  2013   .  Tonsillectomy     .  Colonoscopy with esophagogastroduodenoscopy (egd)  N/A  06/22/2013     Procedure: COLONOSCOPY WITH ESOPHAGOGASTRODUODENOSCOPY (EGD); Surgeon: Rogene Houston, MD; [facility]; Service: Endoscopy; Laterality: N/A; 730   .  Transthoracic echocardiogram   10/24/2012  EF=50-55%, mod conc LVH; RV mod-severely dilated & systolic function mildly reduced; LA severely dialted, possible small PFO; mild mitral annular calcif & mild MR; mod TR & elevated RV systolic pressure, mod pulm HTN; AV mildyl sclerotic & mild-mod regurg (performed for murmur & afib)   .  Total knee arthroplasty  Right  12/07/2013   .  Flexible sigmoidoscopy  N/A  03/14/2014     Procedure: FLEXIBLE SIGMOIDOSCOPY; Surgeon: Rogene Houston, MD; [facility]: [facility]; Service: Endoscopy; Laterality: N/A; 730   .  Polypectomy  N/A  03/14/2014     Procedure: POLYPECTOMY; Surgeon: Rogene Houston, MD; [facility]: [facility]; Service: Endoscopy; Laterality: N/A;   Medications;  Acetaminophen 500 mg every 4 when necessary    Digoxin 0.5 mg daily  Aricept 10 mg at bedtime  Duloxetine 30 mg daily  Fluticasone 2 sprays daily  Protonic 40 mg daily  Atenolol 50 mg daily  Ceftin 300 mg twice a day   Social; patient lives in her own home in [place]. She had recently been started on continuous oxygen.  family history is negative for Colon cancer .  Review of systems  Gen. no complaint of fever or chills her weight has been relatively stable Respiratory; denies any shortness of breath or cough Cardiac no chest pain --does have a history of atrial fibrillation Abdomen -no abdominal pain or constipation   GU no dysuria Musculoskeletal is not really complaining of any joint pain.  Neurologic does not complain of syncopal-type feelings or headache    Physical examination  Denture 98.2-pulse 60 respirations 18 blood pressure 130/65 O2 saturation 95% weight 144.8 Gen. frail-looking woman but alert certainly able to give her own history. No medical distress --sitting comfortably in her wheelchair  Skin is warm and dry   Respiratory; generally clear entry bilaterally no labored breathing  Cardiac; irregular irregular rate and rhythm- . There is a grade 3/6 pansystolic murmur at the lower left sternal border--has some mild lower extremity edema patient states this has not really any different than baseline -- is a trace-1+  Abdomen; slightly distended but bowel sounds positive. No tenderness Extremities; history of bilateral total knee replacements. She has venous stasis mild edema. There is no focal lower extremity findings mild generalized weakness  Psych she is alert and oriented x3 very pleasant and appropriate.  Labs.  07/12/2014.  WBC 3.9 hemoglobin 10.7 platelets 170.  Sodium 136 potassium 3.6 BUN 19 creatinine 0.64.  Bilirubin 2.1-alkaline phosphatase 126-BNP 868-albumin 3.3.    Impression/plan  #1 edema-her weight is relatively stable at this point continue to monitor closely clinically she appears  stable with no increased shortness of breath or chest pain-is ambulating without difficulty  In  her wheelchair-at this point will monitor and this was discussed with Dr. Dellia Nims via phone-at some point she may need Lasix if her weight or edema increases or she shows clinical signs of  CHF--of note will update a metabolic panel .  #2 history of tricuspid and mitral regurgitation  and severe pulmonary hypertension.--This is quite significant but clinically appears to be stable     #3 atrial fibrillation. Heart rate is controlled. At one point she was on anticoagulation however she is not on that currently presumably related to her rectal carcinoma--she is on relatively high dose of digoxin will have to check the level here  #5 adenocarcinoma of the rectum.. She has completed her radiation. Staging CT scans did not show any intra-abdominal metastasis however did show small  pulmonary areas which were probably benign although a followup CT scan was at one point recommended. I am not sure whether this is has been done.  she is following up with oncology in [facility]   #6 depression this seems stable she continues to be quite jovial-interactive socializes easily  #7 mild dementia on Aricept the. She was on Aricept the last time she was in the building. In general conversation I am not really able to pick up her deficits  [id number]--of note greater than 25 minutes spent assessing patient-reviewing her chart-and coordinating and formulating a plan of care-of note greater than 50% of time spent coordinating plan care  .

## 2014-07-16 ENCOUNTER — Encounter: Payer: Self-pay | Admitting: Internal Medicine

## 2014-07-16 DIAGNOSIS — R609 Edema, unspecified: Secondary | ICD-10-CM | POA: Insufficient documentation

## 2014-07-22 ENCOUNTER — Encounter: Payer: Self-pay | Admitting: Internal Medicine

## 2014-07-22 ENCOUNTER — Non-Acute Institutional Stay (SKILLED_NURSING_FACILITY): Payer: Medicare Other | Admitting: Internal Medicine

## 2014-07-22 DIAGNOSIS — C20 Malignant neoplasm of rectum: Secondary | ICD-10-CM

## 2014-07-22 DIAGNOSIS — R5383 Other fatigue: Secondary | ICD-10-CM

## 2014-07-22 DIAGNOSIS — R5381 Other malaise: Secondary | ICD-10-CM

## 2014-07-22 DIAGNOSIS — I5032 Chronic diastolic (congestive) heart failure: Secondary | ICD-10-CM

## 2014-07-22 DIAGNOSIS — F0390 Unspecified dementia without behavioral disturbance: Secondary | ICD-10-CM

## 2014-07-22 DIAGNOSIS — I482 Chronic atrial fibrillation, unspecified: Secondary | ICD-10-CM

## 2014-07-22 DIAGNOSIS — I4891 Unspecified atrial fibrillation: Secondary | ICD-10-CM

## 2014-07-22 DIAGNOSIS — R531 Weakness: Secondary | ICD-10-CM

## 2014-07-22 NOTE — Progress Notes (Signed)
Patient ID: Tamara Mitchell, female   DOB: August 29, 1934, 80 y.o.   MRN: [id number]   Facility; [facility] This is a discharge note   Chief complaint; discharge -  HPI--; this is a 80 year old woman who lives on her own in [place]. We actually had her in the facility in February after a right total knee replacement for rehabilitation. Since she was last here the patient was diagnosed with adenocarcinoma of the rectum  . She has completed a course of radiation and had been on Xeloda.  Recently She had noted increasing weakness with exertion. to the point where she could not walk across a single room in her home without stopping due to exhaustion she was admitted to [facility]. She was felt to have a urinary tract infection and was started on Rocephin. Her urine cultures had no growth. She was also felt to be dehydrated admitted with a BUN of 19 creatinine of 0.64 a sodium of 136 calcium of 8.9 total bilirubin of 2.1 AST of 21 ALT of 13 alkaline phosphatase of 126 of brain natruretic peptide of 868 and an albumin of 3.3. CBC showed a white count of 3.9 hemoglobin of 10.7 noted macrocytic platelet count 170 differential count appeared normal urinalysis showed moderate blood moderate leukocyte esterase. She was hydrated and however developed congestive heart failure and she was given a single dose of Lasix she was sent here due to generalized frailty and a recommendation for physical therapy prior to attempting to return to her own home.  A review of previous records appeared she had been on Lasix at some point-but was not discharged on this.  .  I note for chart review it appears she's gained approximately 5 pounds in the past week-although this has been stable for a couple days-she does not complaining of any acute shortness of breath or changes however appears she may have slightly increased edema from when I saw her last week-she really does not want a prolonged course of diuretic-    She is  slated to go home on September 29-she will be at home with private sitters-she appears to have had a pretty unremarkable course here and has gained strength continues to be quite sociable and engaged with other residents--she has no complaints this evening.     Past Medical History   Diagnosis  Date   .  Essential hypertension, benign    .  Permanent atrial fibrillation    .  Mild dementia    .  Mitral insufficiency      Mild to moderate   .  Tricuspid insufficiency      Moderate   .  Pulmonary hypertension      Severe with RV dysfunction   .  Chronic venous insufficiency    .  History of nuclear stress test  12/12/05/2010     Lexiscan; non-diagnostic for ischemia, low risk   .  Depression    .  GI bleed      Xarelto stopped   .  Rectal polyp      Tubulovillous adenoma with high-grade dysplasia but no evidence of invasion  Adenocarcinoma of the rectum by biopsy June 2015    Past Surgical History   Procedure  Laterality  Date   .  Cholecystectomy   2006   .  Total knee arthroplasty  Left  2013   .  Tonsillectomy     .  Colonoscopy with esophagogastroduodenoscopy (egd)  N/A  06/22/2013  Procedure: COLONOSCOPY WITH ESOPHAGOGASTRODUODENOSCOPY (EGD); Surgeon: Rogene Houston, MD; Location: [facility]; Service: Endoscopy; Laterality: N/A; 730   .  Transthoracic echocardiogram   10/24/2012     EF=50-55%, mod conc LVH; RV mod-severely dilated & systolic function mildly reduced; LA severely dialted, possible small PFO; mild mitral annular calcif & mild MR; mod TR & elevated RV systolic pressure, mod pulm HTN; AV mildyl sclerotic & mild-mod regurg (performed for murmur & afib)   .  Total knee arthroplasty  Right  12/07/2013   .  Flexible sigmoidoscopy  N/A  03/14/2014     Procedure: FLEXIBLE SIGMOIDOSCOPY; Surgeon: Rogene Houston, MD; Location: [facility]; Service: Endoscopy; Laterality: N/A; 730   .  Polypectomy  N/A  03/14/2014     Procedure: POLYPECTOMY; Surgeon: Rogene Houston,  MD; Location: [facility]; Service: Endoscopy; Laterality: N/A;   Medications;  Acetaminophen 500 mg every 4 when necessary  Digoxin 0.5 mg daily  Aricept 10 mg at bedtime  Duloxetine 30 mg daily  Fluticasone 2 sprays daily  Protonic 40 mg daily  Atenolol 50 mg daily  C Social; patient lives in her own home in [place]. She had recently been started on continuous oxygen.  family history is negative for Colon cancer  .  Review of systems  Gen. no complaint of fever or chills Respiratory; denies any shortness of breath or cough  Cardiac no chest pain --does have a history of atrial fibrillation --has edema Abdomen -no abdominal pain or constipation  GU no dysuria  Musculoskeletal is not really complaining of any joint pain.  Neurologic does not complain of syncopal-type feelings or headache   Physical examination  She is afebrile pulse 68-respirations 20-blood pressure quite variable ranging from 142/79-134/79-95/52 I got 102/62 this evening this was done manually ---most recent weight is 148.6  stable for approximately 3 days appears a gain of about 5 pounds over the past week--O2 stats continues to be in the mid 90s on 2 L via nasal cannula  Gen. frail-looking woman but alert certainly able to give her own history. No medical distress --sitting comfortably in her wheelchair  Skin is warm and dry  Respiratory; generally clear entry bilaterally no labored breathing  Cardiac; irregular irregular rate and rhythm-  . There is a grade 3/6 pansystolic murmur at the lower left sternal border--has some lower extremity edema appears mildly increased from last week-1+ ----- Abdomen; slightly distended but bowel sounds positive. No tenderness  Extremities; history of bilateral total knee replacements. She has venous stasis with edema. There is no focal lower extremity findings mild generalized weakness  Psych she is alert and oriented x3 very pleasant and appropriate.     Labs 07/18/2014.  Vitamin B12 405-digoxin 0.38.  07/15/2014.  WBC 4.8 hemoglobin 11.2 platelets 179.  Sodium 138 potassium 4.2 BUN 20 creatinine 0.67.  Marland Kitchen  07/12/2014.  WBC 3.9 hemoglobin 10.7 platelets 170.  Sodium 136 potassium 3.6 BUN 19 creatinine 0.64.  Bilirubin 2.1-alkaline phosphatase 126-BNP 868-albumin 3.3 .  Impression/plan  #1 edema-this appears a bit increased from last week-scales indicate some moderate weight gain-I discussed this with the patient she does not really want to be on aggressive diuretic long-term-but is agreeable to a couple days of Lasix-we'll give her Lasix 20 mg a day for 2 days-continue to monitor weight closely and clinical status-again she does not appear to be in any distress whatsoever Also will give potassium 20 Meq x1 dose-update a metabolic panel on Monday Hold Lasix  for systolic blood pressure less than 100  She does have a history of severe pulmonary hypertension and right ventricular dilation --she is on oxygen 2 L via nasal cannula chronically nonetheless she appears to be stable respiratory-wise l  .  #2 history of tricuspid and mitral regurgitation and severe pulmonary hypertension.--This is quite significant but clinically appears to be stable   #3 atrial fibrillation. Heart rate is controlled. At one point she was on anticoagulation however she is not on that currently presumably related to her rectal carcinoma--she continues on digoxin    #5 adenocarcinoma of the rectum.. She has completed her radiation. Staging CT scans did not show any intra-abdominal metastasis however did show small pulmonary areas which were probably benign although a followup CT scan was at one point recommended. I am not sure whether this is has been done. she is following up with oncology in [facility]   #6 depression this seems stable she continues to be quite jovial-interactive socializes easily   #7 mild dementia on Aricept--this appears to be quite  mild-again she will have private sitters when she goes home  #8-weakness-she is walking  With a cane  at times  still largely ambulates in her wheelchair again she will have sitters with her at home--she appears to be doing relatively well she is bright alert and interactive.  [id number] note greater than 30 minutes spent on this discharge summary     .

## 2014-07-25 ENCOUNTER — Non-Acute Institutional Stay (SKILLED_NURSING_FACILITY): Payer: Medicare Other | Admitting: Internal Medicine

## 2014-07-25 DIAGNOSIS — R531 Weakness: Secondary | ICD-10-CM

## 2014-07-25 DIAGNOSIS — R5383 Other fatigue: Secondary | ICD-10-CM

## 2014-07-25 DIAGNOSIS — R5381 Other malaise: Secondary | ICD-10-CM

## 2014-07-25 DIAGNOSIS — I482 Chronic atrial fibrillation, unspecified: Secondary | ICD-10-CM

## 2014-07-25 DIAGNOSIS — I4891 Unspecified atrial fibrillation: Secondary | ICD-10-CM

## 2014-07-27 NOTE — Progress Notes (Addendum)
Patient ID: BUFFI EWTON, female   DOB: 13-Nov-1933, 80 y.o.   MRN: [id number]               PROGRESS NOTE  DATE:  07/25/2014    FACILITY: [facility]    LEVEL OF CARE:   SNF   Acute Visit/Discharge Visit         CHIEF COMPLAINT:  Pre-discharge review.        HISTORY OF PRESENT ILLNESS:  This is a patient whom I admitted to the building in the middle of this month.  She lives alone in [place], [place].    We have previously had her here for a right total knee replacement.    She has also been diagnosed with adenocarcinoma of the rectum.  [facility] follows with [facility].     She was admitted with generalized weakness to the point that she could not walk across a single room without stopping because of exhaustion.    She was felt to have a UTI.    She was dehydrated.  She was rehydrated and she developed congestive heart failure.  However, she has been restarted on Lasix.    LABORATORY DATA:  Basic metabolic panel this morning shows completely normal values:  Sodium of 141, potassium of 4.1, BUN of 19, creatinine of 0.6.    REVIEW OF SYSTEMS:   CHEST/RESPIRATORY:  The patient is not complaining of cough or shortness of breath.  She is up walking.   CARDIAC:   No complaints of chest pain.   GI:  No nausea or vomiting.    PHYSICAL EXAMINATION:   VITAL SIGNS:   O2 SATURATIONS:  95% on room air.  She walked through her room out into the hallway with her cane and her O2 sat was still 95%.   CHEST/RESPIRATORY:  Clear air entry bilaterally.   CARDIOVASCULAR:  CARDIAC:  Pansystolic murmur.  However, she appears to be euvolemic.    ASSESSMENT/PLAN:  Generalized weakness.  I am not exactly sure of the etiology of this, whether it was a UTI as surmised in the hospital or not.  She has completed antibiotics.  She is doing really very well.  She is up walking with a cane.  She has walkers at home.  She will be going home with sitters.  She will need PT, OT, and  skilled nursing.    History of tricuspid and mitral regurgitation with severe pulmonary hypertension.  She had oxygen at home which I think she used p.r.n.  Her oxygen saturations both at rest and with exercise here are in the mid 90s.    Suggestion that she was on DIG 0.5 on admission.  That was an error in transcription.  She is back on the regular rate of digoxin.    Atrial fibrillation.  Her heart rate is controlled.  She is not currently on anticoagulation.    The patient will need to follow up with Oncology and her primary care physicians in [place].  Everything appears to be stable here.

## 2014-07-28 DIAGNOSIS — I509 Heart failure, unspecified: Secondary | ICD-10-CM

## 2014-07-28 DIAGNOSIS — F329 Major depressive disorder, single episode, unspecified: Secondary | ICD-10-CM

## 2014-07-28 DIAGNOSIS — I10 Essential (primary) hypertension: Secondary | ICD-10-CM

## 2014-08-01 ENCOUNTER — Other Ambulatory Visit (INDEPENDENT_AMBULATORY_CARE_PROVIDER_SITE_OTHER): Payer: Self-pay | Admitting: Internal Medicine

## 2014-08-09 ENCOUNTER — Ambulatory Visit (INDEPENDENT_AMBULATORY_CARE_PROVIDER_SITE_OTHER): Payer: 59 | Admitting: Cardiology

## 2014-08-09 ENCOUNTER — Encounter: Payer: Self-pay | Admitting: Cardiology

## 2014-08-09 VITALS — BP 138/91 | HR 68 | Ht 62.0 in | Wt 152.5 lb

## 2014-08-09 DIAGNOSIS — I482 Chronic atrial fibrillation, unspecified: Secondary | ICD-10-CM

## 2014-08-09 DIAGNOSIS — I2781 Cor pulmonale (chronic): Secondary | ICD-10-CM

## 2014-08-09 NOTE — Assessment & Plan Note (Signed)
Chronic, continue heart rate control strategy. Still not planning to resume anticoagulation with her history of recurring GI bleeding in the setting of rectal carcinoma. She is status post chemotherapy, but it sounds like the cancer was not completely eradicated. She still has follow up with oncology.

## 2014-08-09 NOTE — Assessment & Plan Note (Signed)
Severe RV dilatation with moderately reduced contraction and PASP 68 mmHg by recent echocardiogram.

## 2014-08-09 NOTE — Progress Notes (Signed)
Clinical Summary Tamara Mitchell is a 80 y.o.female last seen in July. Interval record review finds hospitalization at [facility] in September with UTI and dehydration, she was treated with antibiotics, also discharged to rehabilitation.  Lab work from September showed BUN 16, creatinine 0.6, potassium 3.2, normal troponin I, BNP 893, hemoglobin 10.5, platelets 152.   Echocardiogram from June of this year showed moderate LVH with LVEF 55-60%, indeterminate diastolic function with increased filling pressures, mild to moderate mitral regurgitation, sclerotic aortic valve with mild aortic regurgitation, severely dilated RV with moderately reduced contraction, PASP 68 mmHg with elevated CVP, trivial pericardial effusion.  She is here today with a family member. Despite all that has gone with her in the last 6 months, she seems to be doing relatively well. She states she has completed chemotherapy, still following with an oncologist. It does not sound like her rectal carcinoma was completely eradicated however.   No Known Allergies  Current Outpatient Prescriptions  Medication Sig Dispense Refill  . acetaminophen (TYLENOL) 500 MG tablet Take 500 mg by mouth every 6 (six) hours as needed for mild pain.       Marland Kitchen atenolol (TENORMIN) 50 MG tablet Take 50 mg by mouth daily.      . digoxin (LANOXIN) 0.125 MG tablet Take 0.0625 mg by mouth daily.      Marland Kitchen donepezil (ARICEPT) 10 MG tablet Take 10 mg by mouth at bedtime.      . DULoxetine (CYMBALTA) 30 MG capsule Take 30 mg by mouth daily.      . fluticasone (FLONASE) 50 MCG/ACT nasal spray Place 2 sprays into the nose daily as needed for allergies.       . furosemide (LASIX) 40 MG tablet Take 40 mg by mouth daily. Takes half in the morning and half in the evening      . OXYGEN Inhale 2 L into the lungs.      . pantoprazole (PROTONIX) 40 MG tablet TAKE ONE TABLET BY MOUTH DAILY.  60 tablet  5  . polyethylene glycol powder (MIRALAX) powder Take 1 Container by  mouth once.       No current facility-administered medications for this visit.    Past Medical History  Diagnosis Date  . Essential hypertension, benign   . Permanent atrial fibrillation   . Mild dementia   . Mitral insufficiency     Mild to moderate  . Tricuspid insufficiency     Moderate  . Pulmonary hypertension     Severe with RV dysfunction  . Chronic venous insufficiency   . History of nuclear stress test 12/12/05/2010    Lexiscan; non-diagnostic for ischemia, low risk   . Depression   . GI bleed     Xarelto stopped  . Rectal polyp     Tubulovillous adenoma with high-grade dysplasia but no evidence of invasion    Social History Tamara Mitchell reports that she has never smoked. She has never used smokeless tobacco. Tamara Mitchell reports that she does not drink alcohol.  Review of Systems She is still getting home PT and other assistance. Uses a walker. No recent falls. Other systems reviewed and negative.  Physical Examination Filed Vitals:   08/09/14 1557  BP: 138/91  Pulse: 68   Filed Weights   08/09/14 1557  Weight: 152 lb 8 oz (69.174 kg)    Patient appears comfortable at rest.  HEENT: Conjunctiva and lids normal, oropharynx clear.  Neck: Supple, elevated JVP, no carotid bruits, no thyromegaly.  Lungs:  Decreased breath sounds , nonlabored breathing at rest.  Cardiac: Irregularly irregular, no S3, 2/6 apical systolic murmur, prominent S2, no pericardial rub.  Abdomen: Soft, nontender, bowel sounds present, no guarding or rebound.  Extremities: Chronic appearing mild edema, distal pulses 2+.    Problem List and Plan   Atrial fibrillation Chronic, continue heart rate control strategy. Still not planning to resume anticoagulation with her history of recurring GI bleeding in the setting of rectal carcinoma. She is status post chemotherapy, but it sounds like the cancer was not completely eradicated. She still has follow up with oncology.  Cor pulmonale Severe RV  dilatation with moderately reduced contraction and PASP 68 mmHg by recent echocardiogram.    Satira Sark, M.D., F.A.C.C.

## 2014-08-09 NOTE — Patient Instructions (Signed)
Your physician recommends that you schedule a follow-up appointment in: 3 months at the [facility] office. You will receive a reminder letter in the mail in about 1-2 months reminding you to call and schedule your appointment. If you don't receive this letter, please contact our office. Your physician recommends that you continue on your current medications as directed. Please refer to the Current Medication list given to you today.

## 2014-08-16 ENCOUNTER — Telehealth: Payer: Self-pay | Admitting: *Deleted

## 2014-08-16 NOTE — Telephone Encounter (Signed)
Spoke with patient and she was concerned about having sob with walking inside the house. Patient denied chest pain, or dizziness. Patient said she did have oxygen at home but she wasn't using it. Nurse advised patient to use her oxygen to see if her sob improves. Nurse advised patient to call our office back if the sob didn't improve.

## 2014-08-17 ENCOUNTER — Emergency Department (HOSPITAL_COMMUNITY): Payer: 59

## 2014-08-17 ENCOUNTER — Encounter (HOSPITAL_COMMUNITY): Payer: Self-pay | Admitting: Emergency Medicine

## 2014-08-17 ENCOUNTER — Inpatient Hospital Stay (HOSPITAL_COMMUNITY)
Admission: EM | Admit: 2014-08-17 | Discharge: 2014-08-19 | DRG: 189 | Disposition: A | Discharge status: Home Health | Payer: 59 | Attending: Internal Medicine | Admitting: Internal Medicine

## 2014-08-17 DIAGNOSIS — E876 Hypokalemia: Secondary | ICD-10-CM | POA: Diagnosis present

## 2014-08-17 DIAGNOSIS — R0602 Shortness of breath: Secondary | ICD-10-CM

## 2014-08-17 DIAGNOSIS — I50813 Acute on chronic right heart failure: Secondary | ICD-10-CM

## 2014-08-17 DIAGNOSIS — I270 Primary pulmonary hypertension: Secondary | ICD-10-CM

## 2014-08-17 DIAGNOSIS — C20 Malignant neoplasm of rectum: Secondary | ICD-10-CM | POA: Diagnosis present

## 2014-08-17 DIAGNOSIS — I10 Essential (primary) hypertension: Secondary | ICD-10-CM | POA: Diagnosis present

## 2014-08-17 DIAGNOSIS — J9620 Acute and chronic respiratory failure, unspecified whether with hypoxia or hypercapnia: Secondary | ICD-10-CM

## 2014-08-17 DIAGNOSIS — I5021 Acute systolic (congestive) heart failure: Secondary | ICD-10-CM

## 2014-08-17 DIAGNOSIS — I2781 Cor pulmonale (chronic): Secondary | ICD-10-CM | POA: Diagnosis present

## 2014-08-17 DIAGNOSIS — I272 Other secondary pulmonary hypertension: Secondary | ICD-10-CM | POA: Diagnosis present

## 2014-08-17 DIAGNOSIS — Z9981 Dependence on supplemental oxygen: Secondary | ICD-10-CM

## 2014-08-17 DIAGNOSIS — I5023 Acute on chronic systolic (congestive) heart failure: Secondary | ICD-10-CM

## 2014-08-17 DIAGNOSIS — Z66 Do not resuscitate: Secondary | ICD-10-CM | POA: Diagnosis present

## 2014-08-17 DIAGNOSIS — I4891 Unspecified atrial fibrillation: Secondary | ICD-10-CM | POA: Diagnosis present

## 2014-08-17 DIAGNOSIS — Z9221 Personal history of antineoplastic chemotherapy: Secondary | ICD-10-CM

## 2014-08-17 DIAGNOSIS — C7802 Secondary malignant neoplasm of left lung: Secondary | ICD-10-CM | POA: Diagnosis present

## 2014-08-17 DIAGNOSIS — C19 Malignant neoplasm of rectosigmoid junction: Secondary | ICD-10-CM | POA: Diagnosis present

## 2014-08-17 DIAGNOSIS — I482 Chronic atrial fibrillation, unspecified: Secondary | ICD-10-CM

## 2014-08-17 DIAGNOSIS — Z510 Encounter for antineoplastic radiation therapy: Secondary | ICD-10-CM

## 2014-08-17 DIAGNOSIS — R0902 Hypoxemia: Secondary | ICD-10-CM

## 2014-08-17 DIAGNOSIS — I50811 Acute right heart failure: Secondary | ICD-10-CM

## 2014-08-17 DIAGNOSIS — I509 Heart failure, unspecified: Secondary | ICD-10-CM

## 2014-08-17 DIAGNOSIS — J9621 Acute and chronic respiratory failure with hypoxia: Secondary | ICD-10-CM | POA: Diagnosis not present

## 2014-08-17 DIAGNOSIS — I872 Venous insufficiency (chronic) (peripheral): Secondary | ICD-10-CM | POA: Diagnosis present

## 2014-08-17 DIAGNOSIS — R609 Edema, unspecified: Secondary | ICD-10-CM

## 2014-08-17 DIAGNOSIS — Z96659 Presence of unspecified artificial knee joint: Secondary | ICD-10-CM | POA: Diagnosis present

## 2014-08-17 DIAGNOSIS — R918 Other nonspecific abnormal finding of lung field: Secondary | ICD-10-CM

## 2014-08-17 DIAGNOSIS — F0390 Unspecified dementia without behavioral disturbance: Secondary | ICD-10-CM

## 2014-08-17 LAB — DIGOXIN LEVEL: Digoxin Level: 0.5 ng/mL — ABNORMAL LOW (ref 0.8–2.0)

## 2014-08-17 LAB — CBC WITH DIFFERENTIAL/PLATELET
Basophils Absolute: 0 10*3/uL (ref 0.0–0.1)
Basophils Relative: 1 % (ref 0–1)
EOS ABS: 0.2 10*3/uL (ref 0.0–0.7)
Eosinophils Relative: 4 % (ref 0–5)
HCT: 36.4 % (ref 36.0–46.0)
HEMOGLOBIN: 11.4 g/dL — AB (ref 12.0–15.0)
LYMPHS PCT: 17 % (ref 12–46)
Lymphs Abs: 0.8 10*3/uL (ref 0.7–4.0)
MCH: 32.3 pg (ref 26.0–34.0)
MCHC: 31.3 g/dL (ref 30.0–36.0)
MCV: 103.1 fL — ABNORMAL HIGH (ref 78.0–100.0)
MONOS PCT: 11 % (ref 3–12)
Monocytes Absolute: 0.5 10*3/uL (ref 0.1–1.0)
NEUTROS PCT: 67 % (ref 43–77)
Neutro Abs: 3.1 10*3/uL (ref 1.7–7.7)
Platelets: 217 10*3/uL (ref 150–400)
RBC: 3.53 MIL/uL — AB (ref 3.87–5.11)
RDW: 17.5 % — ABNORMAL HIGH (ref 11.5–15.5)
WBC: 4.6 10*3/uL (ref 4.0–10.5)

## 2014-08-17 LAB — BASIC METABOLIC PANEL
Anion gap: 12 (ref 5–15)
BUN: 32 mg/dL — ABNORMAL HIGH (ref 6–23)
CHLORIDE: 103 meq/L (ref 96–112)
CO2: 26 mEq/L (ref 19–32)
Calcium: 9.7 mg/dL (ref 8.4–10.5)
Creatinine, Ser: 0.82 mg/dL (ref 0.50–1.10)
GFR calc Af Amer: 77 mL/min — ABNORMAL LOW (ref >90)
GFR calc non Af Amer: 67 mL/min — ABNORMAL LOW (ref >90)
GLUCOSE: 105 mg/dL — AB (ref 70–99)
POTASSIUM: 4.4 meq/L (ref 3.7–5.3)
Sodium: 141 mEq/L (ref 137–147)

## 2014-08-17 LAB — PRO B NATRIURETIC PEPTIDE: PRO B NATRI PEPTIDE: 10010 pg/mL — AB (ref 0–450)

## 2014-08-17 LAB — TROPONIN I: Troponin I: <0.3 ng/mL (ref <0.30)

## 2014-08-17 LAB — D-DIMER, QUANTITATIVE: D-Dimer, Quant: 2.73 ug{FEU}/mL — ABNORMAL HIGH (ref 0.00–0.48)

## 2014-08-17 MED ORDER — POLYETHYLENE GLYCOL 3350 17 G PO PACK
17.0000 g | PACK | Freq: Every day | ORAL | Status: DC
  Administered 2014-08-18 – 2014-08-19 (×2): 17 g via ORAL
  Filled 2014-08-17 (×2): qty 1

## 2014-08-17 MED ORDER — FUROSEMIDE 10 MG/ML IJ SOLN
40.0000 mg | Freq: Two times a day (BID) | INTRAMUSCULAR | Status: DC
  Administered 2014-08-18 – 2014-08-19 (×3): 40 mg via INTRAVENOUS
  Filled 2014-08-17 (×3): qty 4

## 2014-08-17 MED ORDER — DULOXETINE HCL 30 MG PO CPEP
30.0000 mg | ORAL_CAPSULE | Freq: Every day | ORAL | Status: DC
  Administered 2014-08-18 – 2014-08-19 (×2): 30 mg via ORAL
  Filled 2014-08-17 (×2): qty 1

## 2014-08-17 MED ORDER — ONDANSETRON HCL 4 MG PO TABS: 4.0000 mg | ORAL_TABLET | Freq: Four times a day (QID) | ORAL | Status: DC | PRN

## 2014-08-17 MED ORDER — ENOXAPARIN SODIUM 40 MG/0.4ML ~~LOC~~ SOLN
40.0000 mg | SUBCUTANEOUS | Status: DC
  Administered 2014-08-18 – 2014-08-19 (×2): 40 mg via SUBCUTANEOUS
  Filled 2014-08-17 (×2): qty 0.4

## 2014-08-17 MED ORDER — SODIUM CHLORIDE 0.9 % IV SOLN: 250.0000 mL | INTRAVENOUS | Status: DC | PRN

## 2014-08-17 MED ORDER — IOHEXOL 350 MG/ML SOLN
150.0000 mL | Freq: Once | INTRAVENOUS | Status: AC | PRN
  Administered 2014-08-17: 100 mL via INTRAVENOUS

## 2014-08-17 MED ORDER — ATENOLOL 25 MG PO TABS
50.0000 mg | ORAL_TABLET | Freq: Every day | ORAL | Status: DC
  Administered 2014-08-18 – 2014-08-19 (×2): 50 mg via ORAL
  Filled 2014-08-17 (×2): qty 2

## 2014-08-17 MED ORDER — ALBUTEROL SULFATE (2.5 MG/3ML) 0.083% IN NEBU: 2.5000 mg | INHALATION_SOLUTION | RESPIRATORY_TRACT | Status: DC | PRN

## 2014-08-17 MED ORDER — PANTOPRAZOLE SODIUM 40 MG PO TBEC
40.0000 mg | DELAYED_RELEASE_TABLET | Freq: Every day | ORAL | Status: DC
  Administered 2014-08-18 – 2014-08-19 (×2): 40 mg via ORAL
  Filled 2014-08-17 (×2): qty 1

## 2014-08-17 MED ORDER — ONDANSETRON HCL 4 MG/2ML IJ SOLN: 4.0000 mg | Freq: Four times a day (QID) | INTRAMUSCULAR | Status: DC | PRN

## 2014-08-17 MED ORDER — DIGOXIN 125 MCG PO TABS
0.0625 mg | ORAL_TABLET | Freq: Every day | ORAL | Status: DC
  Administered 2014-08-18 – 2014-08-19 (×2): 0.0625 mg via ORAL
  Filled 2014-08-17 (×2): qty 1

## 2014-08-17 MED ORDER — DONEPEZIL HCL 5 MG PO TABS
10.0000 mg | ORAL_TABLET | Freq: Every day | ORAL | Status: DC
  Administered 2014-08-17 – 2014-08-18 (×2): 10 mg via ORAL
  Filled 2014-08-17 (×2): qty 2

## 2014-08-17 MED ORDER — ACETAMINOPHEN 500 MG PO TABS
500.0000 mg | ORAL_TABLET | Freq: Four times a day (QID) | ORAL | Status: DC | PRN
  Administered 2014-08-18: 500 mg via ORAL
  Filled 2014-08-17: qty 1

## 2014-08-17 MED ORDER — SODIUM CHLORIDE 0.9 % IJ SOLN: 3.0000 mL | INTRAMUSCULAR | Status: DC | PRN

## 2014-08-17 MED ORDER — SODIUM CHLORIDE 0.9 % IJ SOLN
3.0000 mL | Freq: Two times a day (BID) | INTRAMUSCULAR | Status: DC
  Administered 2014-08-17 – 2014-08-19 (×4): 3 mL via INTRAVENOUS

## 2014-08-17 MED ORDER — FUROSEMIDE 10 MG/ML IJ SOLN
40.0000 mg | INTRAMUSCULAR | Status: AC
  Administered 2014-08-17: 40 mg via INTRAVENOUS
  Filled 2014-08-17: qty 4

## 2014-08-17 MED ORDER — ONDANSETRON HCL 4 MG/2ML IJ SOLN: 4.0000 mg | Freq: Three times a day (TID) | INTRAMUSCULAR | Status: AC | PRN

## 2014-08-17 NOTE — ED Notes (Addendum)
Incorrect entry.

## 2014-08-17 NOTE — ED Provider Notes (Signed)
CSN: [id number]     Arrival date & time 08/17/14  1750 History   First MD Initiated Contact with Patient 08/17/14 1751     Chief Complaint  Patient presents with  . Shortness of Breath     (Consider location/radiation/quality/duration/timing/severity/associated sxs/prior Treatment) HPI Comments: The patient is a 80 year old female, history of permanent atrial fibrillation, pulmonary hypertension with severe right ventricular dysfunction, she had a lengthy scan stress test performed in December of 2012 which was nondiagnostic but considered low risk. She presents today because of shortness of breath which he feels has been getting worse over the last several days. She has significant dyspnea on exertion but is short of breath at rest as well. She has a mild intermittent cough, denies fever, and dorsal swelling of the bilateral lower extremities.  The symptoms are persistent, moderate to severe, gradually worsening  [name] presenting with shortness of breath. The history is provided by the patient and medical records.  Shortness of Breath   Past Medical History  Diagnosis Date  . Essential hypertension, benign   . Permanent atrial fibrillation   . Mild dementia   . Mitral insufficiency     Mild to moderate  . Tricuspid insufficiency     Moderate  . Pulmonary hypertension     Severe with RV dysfunction  . Chronic venous insufficiency   . History of nuclear stress test 12/12/05/2010    Lexiscan; non-diagnostic for ischemia, low risk   . Depression   . GI bleed     Xarelto stopped  . 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 Cholecystectomy  2006  . Total knee arthroplasty Left 2013  . Tonsillectomy    . Colonoscopy with esophagogastroduodenoscopy (egd) N/A 06/22/2013    Procedure: COLONOSCOPY WITH ESOPHAGOGASTRODUODENOSCOPY (EGD);  Surgeon: Rogene Houston,  MD;  [facility];  Service: Endoscopy;  Laterality: N/A;  730  . Transthoracic echocardiogram  10/24/2012    EF=50-55%, mod conc LVH; RV mod-severely dilated & systolic function mildly reduced; LA severely dialted, possible small PFO; mild mitral annular calcif & mild MR; mod TR & elevated RV systolic pressure, mod pulm HTN; AV mildyl sclerotic & mild-mod regurg (performed for murmur & afib)  . Total knee arthroplasty Right 12/07/2013  . Flexible sigmoidoscopy N/A 03/14/2014    Procedure: FLEXIBLE SIGMOIDOSCOPY;  Surgeon: Rogene Houston, MD;  [facility];  Service: Endoscopy;  Laterality: N/A;  730  . Polypectomy N/A 03/14/2014    Procedure: POLYPECTOMY;  Surgeon: Rogene Houston, MD;  [facility];  Service: Endoscopy;  Laterality: N/A;   Family History  Problem Relation Age of Onset  . Colon cancer Neg Hx    History  Substance Use Topics  . Smoking status: Never Smoker   . Smokeless tobacco: Never Used  . Alcohol Use: No   OB History   Grav Para Term Preterm Abortions TAB SAB Ect Mult Living                 Review of Systems  Respiratory: Positive for shortness of breath.   All other systems reviewed and are negative.     Allergies  Review of patient's allergies indicates no known allergies.  Home Medications   Prior to Admission medications   Medication Sig Start Date End Date Taking? Authorizing Provider  atenolol (TENORMIN) 50 MG  tablet Take 50 mg by mouth daily.   Yes Historical Provider, MD  digoxin (LANOXIN) 0.125 MG tablet Take 0.0625 mg by mouth daily.   Yes Historical Provider, MD  donepezil (ARICEPT) 10 MG tablet Take 10 mg by mouth at bedtime.   Yes Historical Provider, MD  DULoxetine (CYMBALTA) 30 MG capsule Take 30 mg by mouth daily.   Yes Historical Provider, MD  furosemide (LASIX) 40 MG tablet Take 20 mg by mouth 2 (two) times daily. Takes half in the morning and half in the evening   Yes Historical Provider, MD  OXYGEN  Inhale 2 L into the lungs.   Yes Historical Provider, MD  pantoprazole (PROTONIX) 40 MG tablet Take 40 mg by mouth daily.   Yes Historical Provider, MD  polyethylene glycol (MIRALAX / GLYCOLAX) packet Take 17 g by mouth daily.   Yes Historical Provider, MD  acetaminophen (TYLENOL) 500 MG tablet Take 500 mg by mouth every 6 (six) hours as needed for mild pain.     Historical Provider, MD  fluticasone (FLONASE) 50 MCG/ACT nasal spray Place 2 sprays into the nose daily as needed for allergies.     Historical Provider, MD   BP 125/91  Pulse 83  Temp(Src) 97.4 F (36.3 C) (Oral)  Resp 22  Ht 5\' 2"  (1.575 m)  Wt 147 lb (66.679 kg)  BMI 26.88 kg/m2  SpO2 93% Physical Exam  Nursing note and vitals reviewed. Constitutional: She appears well-developed and well-nourished. No distress.  HENT:  Head: Normocephalic and atraumatic.  Mouth/Throat: Oropharynx is clear and moist. No oropharyngeal exudate.  Eyes: Conjunctivae and EOM are normal. Pupils are equal, round, and reactive to light. Right eye exhibits no discharge. Left eye exhibits no discharge. No scleral icterus.  Neck: Normal range of motion. Neck supple. No JVD present. No thyromegaly present.  Cardiovascular: Normal rate, regular rhythm, normal heart sounds and intact distal pulses.  Exam reveals no gallop and no friction rub.   No murmur heard. Mild JVD  Pulmonary/Chest: Effort normal and breath sounds normal. No respiratory distress. She has no wheezes. She has no rales.  The lungs are clear, the patient is mildly tachypneic, she has no rales or wheezing  Abdominal: Soft. Bowel sounds are normal. She exhibits no distension and no mass. There is no tenderness.  Musculoskeletal: Normal range of motion. She exhibits edema (bilateral peripheral edema, right greater than left). She exhibits no tenderness.  Lymphadenopathy:    She has no cervical adenopathy.  Neurological: She is alert. Coordination normal.  Skin: Skin is warm and dry. No  rash noted. No erythema.  Psychiatric: She has a normal mood and affect. Her behavior is normal.    ED Course  Procedures (including critical care time) Labs Review Labs Reviewed  CBC WITH DIFFERENTIAL - Abnormal; Notable for the following:    RBC 3.53 (*)    Hemoglobin 11.4 (*)    MCV 103.1 (*)    RDW 17.5 (*)    All other components within normal limits  BASIC METABOLIC PANEL - Abnormal; Notable for the following:    Glucose, Bld 105 (*)    BUN 32 (*)    GFR calc non Af Amer 67 (*)    GFR calc Af Amer 77 (*)    All other components within normal limits  PRO B NATRIURETIC PEPTIDE - Abnormal; Notable for the following:    Pro B Natriuretic peptide (BNP) 10010.0 (*)    All other components within normal limits  D-DIMER,  QUANTITATIVE - Abnormal; Notable for the following:    D-Dimer, Quant 2.73 (*)    All other components within normal limits  DIGOXIN LEVEL - Abnormal; Notable for the following:    Digoxin Level 0.5 (*)    All other components within normal limits  TROPONIN I    Imaging Review Dg Chest 2 View  08/17/2014   CLINICAL DATA:  80 year old female with difficulty breathing for 2 days.  EXAM: CHEST  2 VIEW  COMPARISON:  Chest x-ray 04/03/2014.  FINDINGS: No consolidative airspace disease. No definite pleural effusions. No evidence of pulmonary edema. Heart size is moderately enlarged with right atrial dilatation. Upper mediastinal contours are with slightly distorted by patient rotation to the right. Atherosclerosis in the thoracic aorta.  IMPRESSION: 1. Moderate cardiomegaly, without overt signs of congestive heart failure. 2. Atherosclerosis.   Electronically Signed   By: Vinnie Langton M.D.   On: 08/17/2014 19:31   Ct Angio Chest Pe W/cm &/or Wo Cm  08/17/2014   CLINICAL DATA:  History of shortness of breath for 2 days, history of prior colon carcinoma  EXAM: CT ANGIOGRAPHY CHEST WITH CONTRAST  TECHNIQUE: Multidetector CT imaging of the chest was performed using the  standard protocol during bolus administration of intravenous contrast. Multiplanar CT image reconstructions and MIPs were obtained to evaluate the vascular anatomy.  CONTRAST:  137mL OMNIPAQUE IOHEXOL 350 MG/ML SOLN  COMPARISON:  04/03/2014  FINDINGS: The lungs are well aerated bilaterally. A small right-sided pleural effusion is noted. Multiple pulmonary nodules are identified throughout both lungs. These have increased in both size and number when compare with the prior exam. The largest lesion on the right measures just under 14 mm lying within the superior segment of the right lower lobe best seen on image number 49 of series 5. The largest lesion on the left lies in the midportion of the left upper lobe measuring 13 mm. A second 13 mm nodule is noted in the left lower lobe as well given the change from the prior exam these changes are consistent with pulmonary metastatic disease.  Thoracic aorta is within normal limits. The pulmonary artery shows significant central enlargement. No findings to suggest pulmonary emboli are seen. The cardiac shadow is enlarged. 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 Degenerative changes of the thoracic spine are noted. No definitive bony lesions are seen.  Review of the MIP images confirms the above findings.  IMPRESSION: No evidence of pulmonary emboli.  Multiple enlarged pulmonary nodules bilaterally as described. Given the patient's clinical history these are consistent with progressive metastatic disease   Electronically Signed   By: Inez Catalina M.D.   On: 08/17/2014 20:50     EKG Interpretation   Date/Time:  Wednesday August 17 2014 18:06:19 EDT Ventricular Rate:  70 PR Interval:    QRS Duration: 131 QT Interval:  459 QTC Calculation: 495 R Axis:   -131 Text Interpretation:  Atrial fibrillation Right bundle branch block  Abnormal ekg since last tracing no significant change Confirmed by  Dusan Lipford   MD, Stephfon Bovey ([id number]) on 08/17/2014 6:15:30 PM      MDM   Final diagnoses:  Shortness of breath  Hypoxia  Acute systolic congestive heart failure  Pulmonary hypertension    The patient is dyspneic, hypoxic, 76% on room air, 90% on 4 L, EKG shows atrial fibrillation with a right bundle branch block, nonsignificantly changed. Possibly exacerbation of pulmonary hypertension, consider  pulmonary embolism, less likely coronary disease, consider pneumonia, pulmonary edema.  Pt has ongoing hypoxia, VS otherwise stable, has very elevated BNP and peripheral edema c/w CHF, known severe pulmonary  htn but no PE.  Pt will need lasix and admissin - increased O2 req  D/w Dr. Darrick Meigs who will admit.  Meds given in ED:  Medications  furosemide (LASIX) injection 40 mg (not administered)  iohexol (OMNIPAQUE) 350 MG/ML injection 150 mL (100 mLs Intravenous Contrast Given 08/17/14 2014)    New Prescriptions   No medications on file      Johnna Acosta, MD 08/17/14 2105

## 2014-08-17 NOTE — ED Notes (Signed)
Pt comes in today because she feels it has been hard for her to breath over the past couple of days.  Pt denies being in pain. Just shortness of breath. Pt does use oxygen at home.

## 2014-08-17 NOTE — H&P (Signed)
PCP:   Delphina Cahill, MD   Chief Complaint:  Shortness of breath on exertion  HPI: 80 year old female who   has a past medical history of Essential hypertension, benign; Permanent atrial fibrillation; Mild dementia; Mitral insufficiency; Tricuspid insufficiency; Pulmonary hypertension; Chronic venous insufficiency; History of nuclear stress test (12/12/05/2010); Depression; GI bleed; and Rectal polyp. Today presents to the ED with chief complaint of shortness of breath on exertion. Patient has history of right heart failure, pulmonary hypertension and has been taking Lasix 40 mg twice a day at home. She noticed that her the past few days she has been getting short of breath on walking, she denies fever,  no chest pain, no coughing up phlegm. She denies nausea vomiting or diarrhea. Patient is on home oxygen. In the ED patient was found to be hypoxic, d-dimer was elevated to 2.73, CT angiogram of the chest was done which was negative for pulmonary embolism, but does show bilateral metastatic pulmonary nodules. Patient has history of colon cancer and is followed by oncology at [facility]. BNP elevated 10010. Troponin x1 negative in the ED  Allergies:  No Known Allergies    Past Medical History  Diagnosis Date  . Essential hypertension, benign   . Permanent atrial fibrillation   . Mild dementia   . Mitral insufficiency     Mild to moderate  . Tricuspid insufficiency     Moderate  . Pulmonary hypertension     Severe with RV dysfunction  . Chronic venous insufficiency   . History of nuclear stress test 12/12/05/2010    Lexiscan; non-diagnostic for ischemia, low risk   . Depression   . GI bleed     Xarelto stopped  . Rectal polyp     Tubulovillous adenoma with high-grade dysplasia but no evidence of invasion    Past Surgical History  Procedure Laterality Date  . Cholecystectomy  2006  . Total knee arthroplasty Left 2013  . Tonsillectomy    . Colonoscopy with  esophagogastroduodenoscopy (egd) N/A 06/22/2013    Procedure: COLONOSCOPY WITH ESOPHAGOGASTRODUODENOSCOPY (EGD);  Surgeon: Rogene Houston, MD;  [facility];  Service: Endoscopy;  Laterality: N/A;  730  . Transthoracic echocardiogram  10/24/2012    EF=50-55%, mod conc LVH; RV mod-severely dilated & systolic function mildly reduced; LA severely dialted, possible small PFO; mild mitral annular calcif & mild MR; mod TR & elevated RV systolic pressure, mod pulm HTN; AV mildyl sclerotic & mild-mod regurg (performed for murmur & afib)  . Total knee arthroplasty Right 12/07/2013  . Flexible sigmoidoscopy N/A 03/14/2014    Procedure: FLEXIBLE SIGMOIDOSCOPY;  Surgeon: Rogene Houston, MD;  [facility];  Service: Endoscopy;  Laterality: N/A;  730  . Polypectomy N/A 03/14/2014    Procedure: POLYPECTOMY;  Surgeon: Rogene Houston, MD;  [facility];  Service: Endoscopy;  Laterality: N/A;    Prior to Admission medications   Medication Sig Start Date End Date Taking? Authorizing Provider  atenolol (TENORMIN) 50 MG tablet Take 50 mg by mouth daily.   Yes Historical Provider, MD  digoxin (LANOXIN) 0.125 MG tablet Take 0.0625 mg by mouth daily.   Yes Historical Provider, MD  donepezil (ARICEPT) 10 MG tablet Take 10 mg by mouth at bedtime.   Yes Historical Provider, MD  DULoxetine (CYMBALTA) 30 MG capsule Take 30 mg by mouth daily.   Yes Historical Provider, MD  furosemide (LASIX) 40 MG tablet Take 20 mg by mouth 2 (two) times daily. Takes  half in the morning and half in the evening   Yes Historical Provider, MD  OXYGEN Inhale 2 L into the lungs.   Yes Historical Provider, MD  pantoprazole (PROTONIX) 40 MG tablet Take 40 mg by mouth daily.   Yes Historical Provider, MD  polyethylene glycol (MIRALAX / GLYCOLAX) packet Take 17 g by mouth daily.   Yes Historical Provider, MD  acetaminophen (TYLENOL) 500 MG tablet Take 500 mg by mouth every 6 (six) hours as needed for mild pain.      Historical Provider, MD  fluticasone (FLONASE) 50 MCG/ACT nasal spray Place 2 sprays into the nose daily as needed for allergies.     Historical Provider, MD    Social History:  reports that she has never smoked. She has never used smokeless tobacco. She reports that she does not drink alcohol or use illicit drugs.  Family History  Problem Relation Age of Onset  . Colon cancer Neg Hx      All the positives are listed in BOLD  Review of Systems:  HEENT: Headache, blurred vision, runny nose, sore throat Neck: Hypothyroidism, hyperthyroidism,,lymphadenopathy Chest : Shortness of breath, history of COPD, Asthma Heart : Chest pain, history of coronary arterey disease GI:  Nausea, vomiting, diarrhea, constipation, GERD GU: Dysuria, urgency, frequency of urination, hematuria Neuro: Stroke, seizures, syncope Psych: Depression, anxiety, hallucinations, dementia   Physical Exam: Blood pressure 125/91, pulse 83, temperature 97.4 F (36.3 C), temperature source Oral, resp. rate 22, height 5\' 2"  (1.575 m), weight 66.679 kg (147 lb), SpO2 93.00%. Constitutional:   Patient is a well-developed and well-nourished female* in no acute distress and cooperative with exam. Head: Normocephalic and atraumatic Mouth: Mucus membranes moist Eyes: PERRL, EOMI, conjunctivae normal Neck: Supple, No Thyromegaly Cardiovascular: RRR, S1 normal, S2 normal Pulmonary/Chest: Bilateral wheezing Abdominal: Soft. Non-tender, non-distended, bowel sounds are normal, no masses, organomegaly, or guarding present.  Neurological: A&O x3, Strenght is normal and symmetric bilaterally, cranial nerve II-XII are grossly intact, no focal motor deficit, sensory intact to light touch bilaterally.  Extremities : Bilateral 1+ pitting edema of the lower extremities  Labs on Admission:  Basic Metabolic Panel:  Recent Labs Lab 08/17/14 1855  NA 141  K 4.4  CL 103  CO2 26  GLUCOSE 105*  BUN 32*  CREATININE 0.82  CALCIUM  9.7   Liver Function Tests: No results found for this basename: AST, ALT, ALKPHOS, BILITOT, PROT, ALBUMIN,  in the last 168 hours No results found for this basename: LIPASE, AMYLASE,  in the last 168 hours No results found for this basename: AMMONIA,  in the last 168 hours CBC:  Recent Labs Lab 08/17/14 1836  WBC 4.6  NEUTROABS 3.1  HGB 11.4*  HCT 36.4  MCV 103.1*  PLT 217   Cardiac Enzymes:  Recent Labs Lab 08/17/14 1855  TROPONINI <0.30    BNP (last 3 results)  Recent Labs  04/05/14 0558 04/06/14 0538 08/17/14 1855  PROBNP 2771.0* 2663.0* 10010.0*   CBG: No results found for this basename: GLUCAP,  in the last 168 hours  Radiological Exams on Admission: Dg Chest 2 View  08/17/2014   CLINICAL DATA:  80 year old female with difficulty breathing for 2 days.  EXAM: CHEST  2 VIEW  COMPARISON:  Chest x-ray 04/03/2014.  FINDINGS: No consolidative airspace disease. No definite pleural effusions. No evidence of pulmonary edema. Heart size is moderately enlarged with right atrial dilatation. Upper mediastinal contours are with slightly distorted by patient rotation to the right. Atherosclerosis in  the thoracic aorta.  IMPRESSION: 1. Moderate cardiomegaly, without overt signs of congestive heart failure. 2. Atherosclerosis.   Electronically Signed   By: Vinnie Langton M.D.   On: 08/17/2014 19:31   Ct Angio Chest Pe W/cm &/or Wo Cm  08/17/2014   CLINICAL DATA:  History of shortness of breath for 2 days, history of prior colon carcinoma  EXAM: CT ANGIOGRAPHY CHEST WITH CONTRAST  TECHNIQUE: Multidetector CT imaging of the chest was performed using the standard protocol during bolus administration of intravenous contrast. Multiplanar CT image reconstructions and MIPs were obtained to evaluate the vascular anatomy.  CONTRAST:  193mL OMNIPAQUE IOHEXOL 350 MG/ML SOLN  COMPARISON:  04/03/2014  FINDINGS: The lungs are well aerated bilaterally. A small right-sided pleural effusion is  noted. Multiple pulmonary nodules are identified throughout both lungs. These have increased in both size and number when compare with the prior exam. The largest lesion on the right measures just under 14 mm lying within the superior segment of the right lower lobe best seen on image number 49 of series 5. The largest lesion on the left lies in the midportion of the left upper lobe measuring 13 mm. A second 13 mm nodule is noted in the left lower lobe as well given the change from the prior exam these changes are consistent with pulmonary metastatic disease.  Thoracic aorta is within normal limits. The pulmonary artery shows significant central enlargement. No findings to suggest pulmonary emboli are seen. The cardiac shadow is enlarged. 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 Degenerative changes of the thoracic spine are noted. No definitive bony lesions are seen.  Review of the MIP images confirms the above findings.  IMPRESSION: No evidence of pulmonary emboli.  Multiple enlarged pulmonary nodules bilaterally as described. Given the patient's clinical history these are consistent with progressive metastatic disease   Electronically Signed   By: Inez Catalina M.D.   On: 08/17/2014 20:50    EKG: Independently reviewed. Atrial fibrillation   Assessment/Plan Active Problems:   Essential hypertension, benign   Cor pulmonale   Pulmonary hypertension   Acute right heart failure  Cor pulmonale Patient has history of right heart failure, echocardiogram from June 2015 shows moderate LVH, EF 55-60%, severe dilated right ventricle with mild to moderately reduced contraction, severe pulmonary hypertension with pulmonary artery systolic pressure 68 mmHg. Will start patient on Lasix 40 mg IV every 12 hours. Oxygen via nasal cannula, Insertable catheter for strict I.'s and O.'s Will also start albuterol when necessary every 2 hours  nebulizer EKG shows atrial fibrillation, will cycle cardiac enzymes and get her cardiology consultation a.m.  Bilateral metastatic pulmonary nodules Patient has history of colon cancer, and is followed by oncologist at [facility] followup Discussed with patient  and her cousin at bedside  Atrial fibrillation Heart rate controlled, patient not on any anticoagulation due to history of GI bleed. Continue digoxin 0.125 mg, atenolol 50 mg daily  History of dementia Continue Aricept  History of hypertension Continue atenolol  DVT prophylaxis Lovenox  Code status: Patient is DO NOT RESUSCITATE  Family discussion: Admission, patients condition and plan of care including tests being ordered have been discussed with the patient and her cousin at bedside who indicate understanding and agree with the plan and Code Status.   Time Spent on Admission: 60 minutes  [facility] Pager: [phone] 08/17/2014, 9:29 PM  If 7PM-7AM, please contact  night-coverage  [email or web address]  Password [email or web address]

## 2014-08-17 NOTE — ED Notes (Signed)
O2 at 2L via 

## 2014-08-18 DIAGNOSIS — C19 Malignant neoplasm of rectosigmoid junction: Secondary | ICD-10-CM | POA: Diagnosis present

## 2014-08-18 DIAGNOSIS — C7802 Secondary malignant neoplasm of left lung: Secondary | ICD-10-CM | POA: Diagnosis present

## 2014-08-18 DIAGNOSIS — I872 Venous insufficiency (chronic) (peripheral): Secondary | ICD-10-CM | POA: Diagnosis present

## 2014-08-18 DIAGNOSIS — I482 Chronic atrial fibrillation: Secondary | ICD-10-CM

## 2014-08-18 DIAGNOSIS — R0602 Shortness of breath: Secondary | ICD-10-CM | POA: Diagnosis present

## 2014-08-18 DIAGNOSIS — Z66 Do not resuscitate: Secondary | ICD-10-CM | POA: Diagnosis present

## 2014-08-18 DIAGNOSIS — Z9981 Dependence on supplemental oxygen: Secondary | ICD-10-CM | POA: Diagnosis not present

## 2014-08-18 DIAGNOSIS — E876 Hypokalemia: Secondary | ICD-10-CM | POA: Diagnosis present

## 2014-08-18 DIAGNOSIS — I2781 Cor pulmonale (chronic): Secondary | ICD-10-CM | POA: Diagnosis present

## 2014-08-18 DIAGNOSIS — J9621 Acute and chronic respiratory failure with hypoxia: Principal | ICD-10-CM

## 2014-08-18 DIAGNOSIS — Z510 Encounter for antineoplastic radiation therapy: Secondary | ICD-10-CM | POA: Diagnosis not present

## 2014-08-18 DIAGNOSIS — I10 Essential (primary) hypertension: Secondary | ICD-10-CM | POA: Diagnosis present

## 2014-08-18 DIAGNOSIS — Z96659 Presence of unspecified artificial knee joint: Secondary | ICD-10-CM | POA: Diagnosis present

## 2014-08-18 DIAGNOSIS — I5023 Acute on chronic systolic (congestive) heart failure: Secondary | ICD-10-CM | POA: Diagnosis present

## 2014-08-18 DIAGNOSIS — I272 Other secondary pulmonary hypertension: Secondary | ICD-10-CM | POA: Diagnosis present

## 2014-08-18 DIAGNOSIS — F0390 Unspecified dementia without behavioral disturbance: Secondary | ICD-10-CM | POA: Diagnosis present

## 2014-08-18 DIAGNOSIS — R918 Other nonspecific abnormal finding of lung field: Secondary | ICD-10-CM

## 2014-08-18 DIAGNOSIS — Z9221 Personal history of antineoplastic chemotherapy: Secondary | ICD-10-CM | POA: Diagnosis not present

## 2014-08-18 DIAGNOSIS — J9620 Acute and chronic respiratory failure, unspecified whether with hypoxia or hypercapnia: Secondary | ICD-10-CM

## 2014-08-18 LAB — COMPREHENSIVE METABOLIC PANEL
ALK PHOS: 118 U/L — AB (ref 39–117)
ALT: 8 U/L (ref 0–35)
AST: 18 U/L (ref 0–37)
Albumin: 2.9 g/dL — ABNORMAL LOW (ref 3.5–5.2)
Anion gap: 10 (ref 5–15)
BUN: 29 mg/dL — AB (ref 6–23)
CHLORIDE: 102 meq/L (ref 96–112)
CO2: 29 mEq/L (ref 19–32)
CREATININE: 0.78 mg/dL (ref 0.50–1.10)
Calcium: 9.4 mg/dL (ref 8.4–10.5)
GFR calc non Af Amer: 78 mL/min — ABNORMAL LOW (ref >90)
GLUCOSE: 90 mg/dL (ref 70–99)
Potassium: 3.8 mEq/L (ref 3.7–5.3)
Sodium: 141 mEq/L (ref 137–147)
Total Bilirubin: 1.2 mg/dL (ref 0.3–1.2)
Total Protein: 6.6 g/dL (ref 6.0–8.3)

## 2014-08-18 LAB — CBC
HEMATOCRIT: 34 % — AB (ref 36.0–46.0)
HEMOGLOBIN: 11 g/dL — AB (ref 12.0–15.0)
MCH: 32.8 pg (ref 26.0–34.0)
MCHC: 32.4 g/dL (ref 30.0–36.0)
MCV: 101.5 fL — ABNORMAL HIGH (ref 78.0–100.0)
Platelets: 185 10*3/uL (ref 150–400)
RBC: 3.35 MIL/uL — ABNORMAL LOW (ref 3.87–5.11)
RDW: 17.3 % — ABNORMAL HIGH (ref 11.5–15.5)
WBC: 4.8 10*3/uL (ref 4.0–10.5)

## 2014-08-18 LAB — TROPONIN I
Troponin I: <0.3 ng/mL (ref <0.30)
Troponin I: <0.3 ng/mL (ref <0.30)

## 2014-08-18 NOTE — Care Management Note (Signed)
    Page 1 of 2   08/19/2014     12:21:34 PM CARE MANAGEMENT NOTE 08/19/2014  Patient:  Tamara Mitchell,Tamara Mitchell   Account Number:  [id number]  Date Initiated:  08/18/2014  Documentation initiated by:  Vladimir Creeks  Subjective/Objective Assessment:   Admitted with CHF. Pt is from home alone,  with good support from daughter. She has an Engineer, production from [place] 2h a day.     Action/Plan:   Pt will benefit from HH/RN visits at D/C   Anticipated DC Date:  08/19/2014   Anticipated DC Plan:  [place]  CM consult      [facility] Choice  HOME HEALTH   Choice offered to / List presented to:  C-4 Adult Children        [place] arranged  HH-1 RN  [place].   Status of service:  Completed, signed off Medicare Important Message given?   (If response is "NO", the following Medicare IM given date fields will be blank) Date Medicare IM given:   Medicare IM given by:   Date Additional Medicare IM given:   Additional Medicare IM given by:    Discharge Disposition:  [place]  Per UR Regulation:  Reviewed for med. necessity/level of care/duration of stay  If discussed at [place] of Stay Meetings, dates discussed:    Comments:  08/19/14 [place] RN/CM Daughter in the room with pt and stated they would like for pt to go to [facility]. Referred to CSW, but she called and they have no beds at this time. CSW in to speak with the pt and daughter and gave them information to stay in touch with [facility] until a bed is available, at which time she can move there.  Pt will go home with health health. 08/18/14 1700 Vladimir Creeks RN/CM

## 2014-08-18 NOTE — Consult Note (Signed)
Primary cardiologist: Dr. Satira Sark Consulting cardiologist: Dr. Satira Sark  Clinical Summary Tamara Mitchell is a 80 y.o.female past medical history outlined below, admitted with increasing dyspnea on exertion over the last few days. I just saw her recently in the office last week, at which time she was stable from a cardiac perspective. She has reported leg edema, but no palpitations or chest pains. States that she has had to breath more deeply from her "belly" and got concerned.  As noted below, CT imaging of the chest is concerning for progressive metastatic disease with multiple enlarged pulmonary nodules. No evidence of pulmonary embolus. Cardiac markers argue against ACS. Pro-BNP is significantly elevated, likely consistent with myocardial strain in the setting of right heart failure, recent LVEF normal range however.  This morning she is not short of breath at rest, eating breakfast.   No Known Allergies  Medications Scheduled Medications: . atenolol  50 mg Oral Daily  . digoxin  0.0625 mg Oral Daily  . donepezil  10 mg Oral QHS  . DULoxetine  30 mg Oral Daily  . enoxaparin (LOVENOX) injection  40 mg Subcutaneous Q24H  . furosemide  40 mg Intravenous Q12H  . pantoprazole  40 mg Oral Daily  . polyethylene glycol  17 g Oral Daily  . sodium chloride  3 mL Intravenous Q12H    PRN Medications: sodium chloride, acetaminophen, albuterol, ondansetron (ZOFRAN) IV, ondansetron (ZOFRAN) IV, ondansetron, sodium chloride   Past Medical History  Diagnosis Date  . Essential hypertension, benign   . Permanent atrial fibrillation   . Mild dementia   . Mitral insufficiency     Mild to moderate  . Tricuspid insufficiency     Moderate  . Pulmonary hypertension     Severe with RV dysfunction  . Chronic venous insufficiency   . History of nuclear stress test 12/12/05/2010    Lexiscan; non-diagnostic for ischemia, low risk   . Depression   . GI bleed     Xarelto stopped    . Rectal polyp     Tubulovillous adenoma with high-grade dysplasia but no evidence of invasion    Past Surgical History  Procedure Laterality Date  . Cholecystectomy  2006  . Total knee arthroplasty Left 2013  . Tonsillectomy    . Colonoscopy with esophagogastroduodenoscopy (egd) N/A 06/22/2013    Procedure: COLONOSCOPY WITH ESOPHAGOGASTRODUODENOSCOPY (EGD);  Surgeon: Rogene Houston, MD;  Location: [facility];  Service: Endoscopy;  Laterality: N/A;  730  . Transthoracic echocardiogram  10/24/2012    EF=50-55%, mod conc LVH; RV mod-severely dilated & systolic function mildly reduced; LA severely dialted, possible small PFO; mild mitral annular calcif & mild MR; mod TR & elevated RV systolic pressure, mod pulm HTN; AV mildyl sclerotic & mild-mod regurg (performed for murmur & afib)  . Total knee arthroplasty Right 12/07/2013  . Flexible sigmoidoscopy N/A 03/14/2014    Procedure: FLEXIBLE SIGMOIDOSCOPY;  Surgeon: Rogene Houston, MD;  Location: [facility];  Service: Endoscopy;  Laterality: N/A;  730  . Polypectomy N/A 03/14/2014    Procedure: POLYPECTOMY;  Surgeon: Rogene Houston, MD;  Location: [facility];  Service: Endoscopy;  Laterality: N/A;    Family History  Problem Relation Age of Onset  . Colon cancer Neg Hx     Social History [name] reports that she has never smoked. She has never used smokeless tobacco. [name] reports that she does not drink alcohol.  Review of Systems No fevers  or chills. Reports compliance with her oxygen supplementation. Other systems reviewed and negative except as outlined above.  Physical Examination Blood pressure 127/75, pulse 60, temperature 97.9 F (36.6 C), temperature source Oral, resp. rate 20, height 5\' 2"  (1.575 m), weight 150 lb 2.1 oz (68.1 kg), SpO2 94.00%.  Intake/Output Summary (Last 24 hours) at 08/18/14 0841 Last data filed at 08/18/14 0500  Gross per 24 hour  Intake      0 ml  Output   2000 ml  Net  -2000 ml    Telemetry: Rate-controlled atrial fibrillation.  Chronically ill-appearing, no distress. HEENT: Conjunctiva and lids normal, oropharynx clear. Neck: Supple, no elevated JVP or carotid bruits, no thyromegaly. Lungs: Coarse breath sounds, nonlabored breathing at rest. Cardiac: Irregularly irregular, no S3, 2/6 apical systolic murmur, no pericardial rub. Abdomen: Soft, nontender, protuberant, bowel sounds present. Extremities: Chronic appearing edema, distal pulses 2+. Skin: Warm and dry. Musculoskeletal: No kyphosis. Neuropsychiatric: Alert and oriented x3, affect grossly appropriate.   Lab Results  Basic Metabolic Panel:  Recent Labs Lab 08/17/14 1855 08/18/14 0341  NA 141 141  K 4.4 3.8  CL 103 102  CO2 26 29  GLUCOSE 105* 90  BUN 32* 29*  CREATININE 0.82 0.78  CALCIUM 9.7 9.4    Liver Function Tests:  Recent Labs Lab 08/18/14 0341  AST 18  ALT 8  ALKPHOS 118*  BILITOT 1.2  PROT 6.6  ALBUMIN 2.9*    CBC:  Recent Labs Lab 08/17/14 1836 08/18/14 0341  WBC 4.6 4.8  NEUTROABS 3.1  --   HGB 11.4* 11.0*  HCT 36.4 34.0*  MCV 103.1* 101.5*  PLT 217 185    Cardiac Enzymes:  Recent Labs Lab 08/17/14 1855 08/17/14 2311 08/18/14 0341  TROPONINI <0.30 <0.30 <0.30    Pro-BNP: 10010   ECG Atrial fibrillation with controlled ventricular response and right, branch block.  Imaging EXAM: CT ANGIOGRAPHY CHEST WITH CONTRAST  TECHNIQUE: Multidetector CT imaging of the chest was performed using the standard protocol during bolus administration of intravenous contrast. Multiplanar CT image reconstructions and MIPs were obtained to evaluate the vascular anatomy.  CONTRAST: 173mL OMNIPAQUE IOHEXOL 350 MG/ML SOLN  COMPARISON: 04/03/2014  FINDINGS: The lungs are well aerated bilaterally. A small right-sided pleural effusion is noted. Multiple pulmonary nodules are identified throughout both lungs. These have increased in both size and number when  compare with the prior exam. The largest lesion on the right measures just under 14 mm lying within the superior segment of the right lower lobe best seen on image number 49 of series 5. The largest lesion on the left lies in the midportion of the left upper lobe measuring 13 mm. A second 13 mm nodule is noted in the left lower lobe as well given the change from the prior exam these changes are consistent with pulmonary metastatic disease.  Thoracic aorta is within normal limits. The pulmonary artery shows significant central enlargement. No findings to suggest pulmonary emboli are seen. The cardiac shadow is enlarged. 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 Degenerative changes of the thoracic spine are noted. No definitive bony lesions are seen.  Review of the MIP images confirms the above findings.  IMPRESSION: No evidence of pulmonary emboli.  Multiple enlarged pulmonary nodules bilaterally as described. Given the patient's clinical history these are consistent with progressive metastatic disease   Impression  1. Progressive shortness of breath, multifactorial in the setting of  apparent progressive metastatic disease with multiple pulmonary nodules by recent chest CT, oxygen requirement, severe pulmonary hypertension.  2. Chronic cor pulmonale with severe pulmonary hypertension. On chronic diuretics.  3. Chronic atrial fibrillation, rate controlled. She is not anticoagulate with history of recurrent GI bleeding and rectal cancer.  4. Rectal carcinoma status post chemotherapy.   Recommendations  Agree with conversion to IV Lasix for now, otherwise continue digoxin and Tenormin for heart rate control of atrial fibrillation. Diuresis may help somewhat in terms of symptoms, although rectal carcinoma with evidence of progressive metastatic pulmonary nodules looks to be the main issue here.  Satira Sark, M.D., F.A.C.C.

## 2014-08-18 NOTE — Care Management Utilization Note (Signed)
UR completed 

## 2014-08-18 NOTE — Progress Notes (Signed)
[facility] PROGRESS NOTE  Tamara Mitchell OXB:[id number] DOB: 09-Apr-1934 DOA: 08/17/2014 PCP: Delphina Cahill, MD  Assessment/Plan: Acute on chronic respiratory failure - She has severe pulmonary hypertension, cor pulmonale with right-sided heart failure.  -Suspect her progressive metastatic pulmonary nodules however to be the main issue at present. -Continue oxygen support as needed.  Colorectal adenocarcinoma -Status post chemotherapy radiation. -CT chest done on admission shows presence of pulmonary nodules that are presumed metastatic. -I have advised her to secure outpatient followup with her oncologist in [place].  Right-sided heart failure/severe pulmonary hypertension/cor pulmonale -Has had good output with diuresis.  -Continue IV Lasix for now. -Appreciate cardiology input.  Atrial fibrillation -Continue atenolol and digoxin. -Not chronically anticoagulated.  Code Status: DO NOT RESUSCITATE Family Communication: Family friend at bedside updated on plan of care  Disposition Plan: Home once medically ready, likely 24-48 hours   Consultants:  Cardiology, Dr. Domenic Polite   Antibiotics:  None   Subjective: Shortness of breath has improved  Objective: Filed Vitals:   08/17/14 2314 08/17/14 2323 08/18/14 0550 08/18/14 1052  BP: 134/83  127/75   Pulse: 75  60 75  Temp: 98.2 F (36.8 C)  97.9 F (36.6 C)   TempSrc: Oral  Oral   Resp: 20  20   Height:      Weight: 68.1 kg (150 lb 2.1 oz)     SpO2: 98% 98% 94%     Intake/Output Summary (Last 24 hours) at 08/18/14 1256 Last data filed at 08/18/14 1059  Gross per 24 hour  Intake    123 ml  Output   4500 ml  Net  -4377 ml   Filed Weights   08/17/14 1754 08/17/14 2314  Weight: 66.679 kg (147 lb) 68.1 kg (150 lb 2.1 oz)    Exam:   General:  Alert, awake, oriented x3  Cardiovascular: Irregular, positive systolic murmur  Respiratory: Coarse bilateral breath sounds  Abdomen: Soft, nontender,  nondistended, positive bowel sounds  Extremities: 1+ pitting edema bilaterally   Neurologic:  Intact/nonfocal  Data Reviewed: Basic Metabolic Panel:  Recent Labs Lab 08/17/14 1855 08/18/14 0341  NA 141 141  K 4.4 3.8  CL 103 102  CO2 26 29  GLUCOSE 105* 90  BUN 32* 29*  CREATININE 0.82 0.78  CALCIUM 9.7 9.4   Liver Function Tests:  Recent Labs Lab 08/18/14 0341  AST 18  ALT 8  ALKPHOS 118*  BILITOT 1.2  PROT 6.6  ALBUMIN 2.9*   No results found for this basename: LIPASE, AMYLASE,  in the last 168 hours No results found for this basename: AMMONIA,  in the last 168 hours CBC:  Recent Labs Lab 08/17/14 1836 08/18/14 0341  WBC 4.6 4.8  NEUTROABS 3.1  --   HGB 11.4* 11.0*  HCT 36.4 34.0*  MCV 103.1* 101.5*  PLT 217 185   Cardiac Enzymes:  Recent Labs Lab 08/17/14 1855 08/17/14 2311 08/18/14 0341 08/18/14 1020  TROPONINI <0.30 <0.30 <0.30 <0.30   BNP (last 3 results)  Recent Labs  04/05/14 0558 04/06/14 0538 08/17/14 1855  PROBNP 2771.0* 2663.0* 10010.0*   CBG: No results found for this basename: GLUCAP,  in the last 168 hours  No results found for this or any previous visit (from the past 240 hour(s)).   Studies: Dg Chest 2 View  08/17/2014   CLINICAL DATA:  80 year old female with difficulty breathing for 2 days.  EXAM: CHEST  2 VIEW  COMPARISON:  Chest x-ray 04/03/2014.  FINDINGS: No  consolidative airspace disease. No definite pleural effusions. No evidence of pulmonary edema. Heart size is moderately enlarged with right atrial dilatation. Upper mediastinal contours are with slightly distorted by patient rotation to the right. Atherosclerosis in the thoracic aorta.  IMPRESSION: 1. Moderate cardiomegaly, without overt signs of congestive heart failure. 2. Atherosclerosis.   Electronically Signed   By: Vinnie Langton M.D.   On: 08/17/2014 19:31   Ct Angio Chest Pe W/cm &/or Wo Cm  08/17/2014   CLINICAL DATA:  History of shortness of breath  for 2 days, history of prior colon carcinoma  EXAM: CT ANGIOGRAPHY CHEST WITH CONTRAST  TECHNIQUE: Multidetector CT imaging of the chest was performed using the standard protocol during bolus administration of intravenous contrast. Multiplanar CT image reconstructions and MIPs were obtained to evaluate the vascular anatomy.  CONTRAST:  130mL OMNIPAQUE IOHEXOL 350 MG/ML SOLN  COMPARISON:  04/03/2014  FINDINGS: The lungs are well aerated bilaterally. A small right-sided pleural effusion is noted. Multiple pulmonary nodules are identified throughout both lungs. These have increased in both size and number when compare with the prior exam. The largest lesion on the right measures just under 14 mm lying within the superior segment of the right lower lobe best seen on image number 49 of series 5. The largest lesion on the left lies in the midportion of the left upper lobe measuring 13 mm. A second 13 mm nodule is noted in the left lower lobe as well given the change from the prior exam these changes are consistent with pulmonary metastatic disease.  Thoracic aorta is within normal limits. The pulmonary artery shows significant central enlargement. No findings to suggest pulmonary emboli are seen. The cardiac shadow is enlarged. 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 Degenerative changes of the thoracic spine are noted. No definitive bony lesions are seen.  Review of the MIP images confirms the above findings.  IMPRESSION: No evidence of pulmonary emboli.  Multiple enlarged pulmonary nodules bilaterally as described. Given the patient's clinical history these are consistent with progressive metastatic disease   Electronically Signed   By: Inez Catalina M.D.   On: 08/17/2014 20:50    Scheduled Meds: . atenolol  50 mg Oral Daily  . digoxin  0.0625 mg Oral Daily  . donepezil  10 mg Oral QHS  . DULoxetine  30 mg Oral Daily  . enoxaparin (LOVENOX)  injection  40 mg Subcutaneous Q24H  . furosemide  40 mg Intravenous Q12H  . pantoprazole  40 mg Oral Daily  . polyethylene glycol  17 g Oral Daily  . sodium chloride  3 mL Intravenous Q12H   Continuous Infusions:   Principal Problem:   Acute on chronic respiratory failure Active Problems:   Essential hypertension, benign   Atrial fibrillation   Cor pulmonale   Adenocarcinoma of rectum   Pulmonary hypertension   Acute right heart failure   Pulmonary nodules/lesions, multiple    Time spent: 45 minutes. Greater than 50% of this time was spent in direct contact with the patient coordinating care.    Lelon Frohlich  [facility] Pager [phone]  If 7PM-7AM, please contact night-coverage at [email or web address], password [facility] 08/18/2014, 12:56 PM  LOS: 1 day

## 2014-08-19 DIAGNOSIS — I5023 Acute on chronic systolic (congestive) heart failure: Secondary | ICD-10-CM

## 2014-08-19 DIAGNOSIS — I50813 Acute on chronic right heart failure: Secondary | ICD-10-CM

## 2014-08-19 LAB — CBC
HCT: 32.7 % — ABNORMAL LOW (ref 36.0–46.0)
Hemoglobin: 10.5 g/dL — ABNORMAL LOW (ref 12.0–15.0)
MCH: 32.2 pg (ref 26.0–34.0)
MCHC: 32.1 g/dL (ref 30.0–36.0)
MCV: 100.3 fL — AB (ref 78.0–100.0)
PLATELETS: 211 10*3/uL (ref 150–400)
RBC: 3.26 MIL/uL — ABNORMAL LOW (ref 3.87–5.11)
RDW: 17.3 % — AB (ref 11.5–15.5)
WBC: 5.3 10*3/uL (ref 4.0–10.5)

## 2014-08-19 LAB — BASIC METABOLIC PANEL
Anion gap: 10 (ref 5–15)
BUN: 22 mg/dL (ref 6–23)
CALCIUM: 9 mg/dL (ref 8.4–10.5)
CO2: 35 mEq/L — ABNORMAL HIGH (ref 19–32)
Chloride: 95 mEq/L — ABNORMAL LOW (ref 96–112)
Creatinine, Ser: 0.85 mg/dL (ref 0.50–1.10)
GFR calc Af Amer: 74 mL/min — ABNORMAL LOW (ref >90)
GFR, EST NON AFRICAN AMERICAN: 64 mL/min — AB (ref >90)
Glucose, Bld: 90 mg/dL (ref 70–99)
Potassium: 3.2 mEq/L — ABNORMAL LOW (ref 3.7–5.3)
Sodium: 140 mEq/L (ref 137–147)

## 2014-08-19 LAB — MAGNESIUM: Magnesium: 1.5 mg/dL (ref 1.5–2.5)

## 2014-08-19 MED ORDER — POTASSIUM CHLORIDE ER 10 MEQ PO TBCR: 10.0000 meq | EXTENDED_RELEASE_TABLET | Freq: Every day | ORAL | Status: AC

## 2014-08-19 MED ORDER — FUROSEMIDE 10 MG/ML IJ SOLN: 40.0000 mg | Freq: Every day | INTRAMUSCULAR | Status: DC

## 2014-08-19 MED ORDER — POTASSIUM CHLORIDE CRYS ER 20 MEQ PO TBCR
40.0000 meq | EXTENDED_RELEASE_TABLET | Freq: Two times a day (BID) | ORAL | Status: DC
  Administered 2014-08-19: 40 meq via ORAL
  Filled 2014-08-19: qty 2

## 2014-08-19 MED ORDER — FUROSEMIDE 40 MG PO TABS: 40.0000 mg | ORAL_TABLET | Freq: Every day | ORAL | Status: AC

## 2014-08-19 NOTE — Clinical Social Work Note (Signed)
CM reports pt is interested in going to [facility]. CSW met with pt to discuss further. She is aware that there are no beds available at [facility] currently. Pt states she has friends there and is really only interested in this facility right now. She plans to contact them and put her name on wait list. Contact information provided to pt. She [facility] as she is being d/c today.   Benay Pike, [place]

## 2014-08-19 NOTE — Progress Notes (Signed)
Discharge instructions reviewed with pt and family. Pt had no concerns or questions. Case Management has offered information regarding home health. Pt will be leaving soon

## 2014-08-19 NOTE — Progress Notes (Signed)
Patient ID: CHERYLEE RAWLINSON, female   DOB: 1934-03-08, 80 y.o.   MRN: [id number]     Subjective:    SOB improving  Objective:   Temp:  [97.8 F (36.6 C)-98.2 F (36.8 C)] 98 F (36.7 C) (10/23 0411) Pulse Rate:  [70-75] 70 (10/23 0411) Resp:  [18-20] 18 (10/23 0411) BP: (106-131)/(48-69) 131/69 mmHg (10/23 0411) SpO2:  [97 %-100 %] 97 % (10/23 0411) Last BM Date: 08/18/14  Filed Weights   08/17/14 1754 08/17/14 2314  Weight: 147 lb (66.679 kg) 150 lb 2.1 oz (68.1 kg)    Intake/Output Summary (Last 24 hours) at 08/19/14 7782 Last data filed at 08/19/14 0914  Gross per 24 hour  Intake    483 ml  Output   4850 ml  Net  -4367 ml    Telemetry: afib  Exam:  General: NAD  Resp: CTAB  Cardiac: irreg, rate 60, 2/6 systolic murmur at apex, JVD to earlobe  GI: abdomen soft, NT, ND  MSK: trace bilateral edema  Neuro: no focal deficits  Psych: appropriate affect  Lab Results:  Basic Metabolic Panel:  Recent Labs Lab 08/17/14 1855 08/18/14 0341 08/19/14 0532  NA 141 141 140  K 4.4 3.8 3.2*  CL 103 102 95*  CO2 26 29 35*  GLUCOSE 105* 90 90  BUN 32* 29* 22  CREATININE 0.82 0.78 0.85  CALCIUM 9.7 9.4 9.0    Liver Function Tests:  Recent Labs Lab 08/18/14 0341  AST 18  ALT 8  ALKPHOS 118*  BILITOT 1.2  PROT 6.6  ALBUMIN 2.9*    CBC:  Recent Labs Lab 08/17/14 1836 08/18/14 0341 08/19/14 0532  WBC 4.6 4.8 5.3  HGB 11.4* 11.0* 10.5*  HCT 36.4 34.0* 32.7*  MCV 103.1* 101.5* 100.3*  PLT 217 185 211    Cardiac Enzymes:  Recent Labs Lab 08/17/14 2311 08/18/14 0341 08/18/14 1020  TROPONINI <0.30 <0.30 <0.30    BNP:  Recent Labs  04/05/14 0558 04/06/14 0538 08/17/14 1855  PROBNP 2771.0* 2663.0* 10010.0*    Coagulation: No results found for this basename: INR,  in the last 168 hours  ECG:   Medications:   Scheduled Medications: . atenolol  50 mg Oral Daily  . digoxin  0.0625 mg Oral Daily  . donepezil  10 mg Oral QHS  .  DULoxetine  30 mg Oral Daily  . enoxaparin (LOVENOX) injection  40 mg Subcutaneous Q24H  . furosemide  40 mg Intravenous Q12H  . pantoprazole  40 mg Oral Daily  . polyethylene glycol  17 g Oral Daily  . sodium chloride  3 mL Intravenous Q12H     Infusions:     PRN Medications:  sodium chloride, acetaminophen, albuterol, ondansetron (ZOFRAN) IV, ondansetron, sodium chloride     Assessment/Plan    80 yo female hx of HTN, afib, MR, severe pulm HTN with RV dysfunction, hx of prior GI bleed on xarelto admitted with SOB.  1. SOB - CT PE without PE, does show multiple enlarged pulmonary nodlues worrisome for metastatic disease - CXR cardiomegaly, without pulm edema - likely multifactorial due to likely lung mets and acute on chronic right sided HF.   2. Acute on chronic right sided systolic HF - echo 01/2352 LVEF 55-60%, could not evaluate diastolic function, severe RV dilatation with moderately reduced function, PASP 68 - negative 4.5 liters yesterday, negative 6.2 liters since admission. Renal function remains stable. She is on lasix 40mg  IV bid, will change to once daily, negative  4 liters is above daily diuresis goals and increases risk of AKI.  - likely can change back to oral diuretic tomorrow  3. Afib - rate controlled, not on anticoag due to history of GI bleed  4. Hypokalemia - due to diuretics, please keep K at 4 and Mg at 2. I have written for KCl 40 mEq x 2 today, added on Mg to AM labs.  Carlyle Dolly, M.D.

## 2014-08-19 NOTE — Discharge Summary (Signed)
Physician Discharge Summary  Tamara Mitchell HQR:[id number] DOB: January 13, 1934 DOA: 08/17/2014  PCP: Delphina Cahill, MD  Admit date: 08/17/2014 Discharge date: 08/19/2014  Time spent: 45 minutes  Recommendations for Outpatient Follow-up:  -Will be discharged home today. -[facility] services will be arranged. -Family has been instructed to secure OP follow up with her oncologist.  - Advised to follow up with her cardiologist as scheduled.  Discharge Diagnoses:  Principal Problem:   Acute on chronic respiratory failure Active Problems:   Essential hypertension, benign   Atrial fibrillation   Cor pulmonale   Adenocarcinoma of rectum   Pulmonary hypertension   Acute right heart failure   Pulmonary nodules/lesions, multiple   Acute on chronic systolic right heart failure   Discharge Condition: Stable and improved  Filed Weights   08/17/14 1754 08/17/14 2314  Weight: 66.679 kg (147 lb) 68.1 kg (150 lb 2.1 oz)    History of present illness:  80 year old female who has a past medical history of Essential hypertension, benign; Permanent atrial fibrillation; Mild dementia; Mitral insufficiency; Tricuspid insufficiency; Pulmonary hypertension; Chronic venous insufficiency; History of nuclear stress test (12/12/05/2010); Depression; GI bleed; and Rectal polyp.  Today presents to the ED with chief complaint of shortness of breath on exertion. Patient has history of right heart failure, pulmonary hypertension and has been taking Lasix 40 mg twice a day at home. She noticed that her the past few days she has been getting short of breath on walking, she denies fever, no chest pain, no coughing up phlegm. She denies nausea vomiting or diarrhea. Patient is on home oxygen.  In the ED patient was found to be hypoxic, d-dimer was elevated to 2.73, CT angiogram of the chest was done which was negative for pulmonary embolism, but does show bilateral metastatic pulmonary nodules. Patient has history of colon cancer  and is followed by oncology at [facility].  BNP elevated 10010. Troponin x1 negative in the ED   Hospital Course:   Acute on chronic respiratory failure  - She has severe pulmonary hypertension, cor pulmonale with right-sided heart failure.  -Suspect her progressive metastatic pulmonary nodules however to be the main issue at present.  -Resolved and back to baseline levels of oxygen.  Colorectal adenocarcinoma  -Status post chemotherapy radiation.  -CT chest done on admission shows presence of pulmonary nodules that are presumed metastatic.  -I have advised her to secure outpatient followup with her oncologist in [place].   Right-sided heart failure/severe pulmonary hypertension/cor pulmonale  -Has had good output with diuresis. (6.5 L since admission). -Appears adequately diuresed. -Appreciate cardiology input.  -Continue home medications.  Atrial fibrillation  -Continue atenolol and digoxin.  -Not chronically anticoagulated     Procedures:  None   Consultations:  Cardiology, Dr. Harl Bowie  Discharge Instructions  Discharge Instructions   Diet - low sodium heart healthy    Complete by:  As directed      Increase activity slowly    Complete by:  As directed             Medication List         acetaminophen 500 MG tablet  Commonly known as:  TYLENOL  Take 500 mg by mouth every 6 (six) hours as needed for mild pain.     atenolol 50 MG tablet  Commonly known as:  TENORMIN  Take 50 mg by mouth daily.     digoxin 0.125 MG tablet  Commonly known as:  LANOXIN  Take 0.0625 mg by  mouth daily.     donepezil 10 MG tablet  Commonly known as:  ARICEPT  Take 10 mg by mouth at bedtime.     DULoxetine 30 MG capsule  Commonly known as:  CYMBALTA  Take 30 mg by mouth daily.     fluticasone 50 MCG/ACT nasal spray  Commonly known as:  FLONASE  Place 2 sprays into the nose daily as needed for allergies.     furosemide 40 MG tablet  Commonly known as:   LASIX  Take 1 tablet (40 mg total) by mouth daily.     OXYGEN  Inhale 2 L into the lungs.     pantoprazole 40 MG tablet  Commonly known as:  PROTONIX  Take 40 mg by mouth daily.     polyethylene glycol packet  Commonly known as:  MIRALAX / GLYCOLAX  Take 17 g by mouth daily.     potassium chloride 10 MEQ tablet  Commonly known as:  K-DUR  Take 1 tablet (10 mEq total) by mouth daily.       No Known Allergies    The results of significant diagnostics from this hospitalization (including imaging, microbiology, ancillary and laboratory) are listed below for reference.    Significant Diagnostic Studies: Dg Chest 2 View  08/17/2014   CLINICAL DATA:  80 year old female with difficulty breathing for 2 days.  EXAM: CHEST  2 VIEW  COMPARISON:  Chest x-ray 04/03/2014.  FINDINGS: No consolidative airspace disease. No definite pleural effusions. No evidence of pulmonary edema. Heart size is moderately enlarged with right atrial dilatation. Upper mediastinal contours are with slightly distorted by patient rotation to the right. Atherosclerosis in the thoracic aorta.  IMPRESSION: 1. Moderate cardiomegaly, without overt signs of congestive heart failure. 2. Atherosclerosis.   Electronically Signed   By: Vinnie Langton M.D.   On: 08/17/2014 19:31   Ct Angio Chest Pe W/cm &/or Wo Cm  08/17/2014   CLINICAL DATA:  History of shortness of breath for 2 days, history of prior colon carcinoma  EXAM: CT ANGIOGRAPHY CHEST WITH CONTRAST  TECHNIQUE: Multidetector CT imaging of the chest was performed using the standard protocol during bolus administration of intravenous contrast. Multiplanar CT image reconstructions and MIPs were obtained to evaluate the vascular anatomy.  CONTRAST:  158mL OMNIPAQUE IOHEXOL 350 MG/ML SOLN  COMPARISON:  04/03/2014  FINDINGS: The lungs are well aerated bilaterally. A small right-sided pleural effusion is noted. Multiple pulmonary nodules are identified throughout both lungs.  These have increased in both size and number when compare with the prior exam. The largest lesion on the right measures just under 14 mm lying within the superior segment of the right lower lobe best seen on image number 49 of series 5. The largest lesion on the left lies in the midportion of the left upper lobe measuring 13 mm. A second 13 mm nodule is noted in the left lower lobe as well given the change from the prior exam these changes are consistent with pulmonary metastatic disease.  Thoracic aorta is within normal limits. The pulmonary artery shows significant central enlargement. No findings to suggest pulmonary emboli are seen. The cardiac shadow is enlarged. 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 Degenerative changes of the thoracic spine are noted. No definitive bony lesions are seen.  Review of the MIP images confirms the above findings.  IMPRESSION: No evidence of pulmonary emboli.  Multiple enlarged pulmonary nodules bilaterally as described.  Given the patient's clinical history these are consistent with progressive metastatic disease   Electronically Signed   By: Inez Catalina M.D.   On: 08/17/2014 20:50    Microbiology: No results found for this or any previous visit (from the past 240 hour(s)).   Labs: Basic Metabolic Panel:  Recent Labs Lab 08/17/14 1855 08/18/14 0341 08/19/14 0532 08/19/14 1000  NA 141 141 140  --   K 4.4 3.8 3.2*  --   CL 103 102 95*  --   CO2 26 29 35*  --   GLUCOSE 105* 90 90  --   BUN 32* 29* 22  --   CREATININE 0.82 0.78 0.85  --   CALCIUM 9.7 9.4 9.0  --   MG  --   --   --  1.5   Liver Function Tests:  Recent Labs Lab 08/18/14 0341  AST 18  ALT 8  ALKPHOS 118*  BILITOT 1.2  PROT 6.6  ALBUMIN 2.9*   No results found for this basename: LIPASE, AMYLASE,  in the last 168 hours No results found for this basename: AMMONIA,  in the last 168 hours CBC:  Recent Labs Lab  08/17/14 1836 08/18/14 0341 08/19/14 0532  WBC 4.6 4.8 5.3  NEUTROABS 3.1  --   --   HGB 11.4* 11.0* 10.5*  HCT 36.4 34.0* 32.7*  MCV 103.1* 101.5* 100.3*  PLT 217 185 211   Cardiac Enzymes:  Recent Labs Lab 08/17/14 1855 08/17/14 2311 08/18/14 0341 08/18/14 1020  TROPONINI <0.30 <0.30 <0.30 <0.30   BNP: BNP (last 3 results)  Recent Labs  04/05/14 0558 04/06/14 0538 08/17/14 1855  PROBNP 2771.0* 2663.0* 10010.0*   CBG: No results found for this basename: GLUCAP,  in the last 168 hours     Signed:  Lelon Frohlich  [facility] Pager: [phone] 08/19/2014, 3:16 PM

## 2014-10-15 ENCOUNTER — Encounter (HOSPITAL_COMMUNITY): Payer: Self-pay | Admitting: *Deleted

## 2014-10-15 ENCOUNTER — Emergency Department (HOSPITAL_COMMUNITY)

## 2014-10-15 ENCOUNTER — Inpatient Hospital Stay (HOSPITAL_COMMUNITY)
Admission: EM | Admit: 2014-10-15 | Discharge: 2014-10-17 | DRG: 181 | Disposition: A | Discharge status: Home / Self Care | Attending: Family Medicine | Admitting: Family Medicine

## 2014-10-15 DIAGNOSIS — C189 Malignant neoplasm of colon, unspecified: Secondary | ICD-10-CM | POA: Diagnosis present

## 2014-10-15 DIAGNOSIS — I340 Nonrheumatic mitral (valve) insufficiency: Secondary | ICD-10-CM | POA: Diagnosis present

## 2014-10-15 DIAGNOSIS — Z9981 Dependence on supplemental oxygen: Secondary | ICD-10-CM | POA: Diagnosis not present

## 2014-10-15 DIAGNOSIS — Z9221 Personal history of antineoplastic chemotherapy: Secondary | ICD-10-CM | POA: Diagnosis not present

## 2014-10-15 DIAGNOSIS — J910 Malignant pleural effusion: Secondary | ICD-10-CM | POA: Diagnosis present

## 2014-10-15 DIAGNOSIS — Z9049 Acquired absence of other specified parts of digestive tract: Secondary | ICD-10-CM | POA: Diagnosis present

## 2014-10-15 DIAGNOSIS — I10 Essential (primary) hypertension: Secondary | ICD-10-CM | POA: Diagnosis present

## 2014-10-15 DIAGNOSIS — R0902 Hypoxemia: Secondary | ICD-10-CM

## 2014-10-15 DIAGNOSIS — I272 Other secondary pulmonary hypertension: Secondary | ICD-10-CM | POA: Diagnosis present

## 2014-10-15 DIAGNOSIS — C7801 Secondary malignant neoplasm of right lung: Principal | ICD-10-CM | POA: Diagnosis present

## 2014-10-15 DIAGNOSIS — F0390 Unspecified dementia without behavioral disturbance: Secondary | ICD-10-CM | POA: Diagnosis present

## 2014-10-15 DIAGNOSIS — J90 Pleural effusion, not elsewhere classified: Secondary | ICD-10-CM | POA: Insufficient documentation

## 2014-10-15 DIAGNOSIS — C7931 Secondary malignant neoplasm of brain: Secondary | ICD-10-CM | POA: Diagnosis present

## 2014-10-15 DIAGNOSIS — Z96653 Presence of artificial knee joint, bilateral: Secondary | ICD-10-CM | POA: Diagnosis present

## 2014-10-15 DIAGNOSIS — M25512 Pain in left shoulder: Secondary | ICD-10-CM | POA: Diagnosis present

## 2014-10-15 DIAGNOSIS — M1990 Unspecified osteoarthritis, unspecified site: Secondary | ICD-10-CM | POA: Diagnosis present

## 2014-10-15 DIAGNOSIS — Z9889 Other specified postprocedural states: Secondary | ICD-10-CM

## 2014-10-15 DIAGNOSIS — C7802 Secondary malignant neoplasm of left lung: Secondary | ICD-10-CM | POA: Diagnosis present

## 2014-10-15 DIAGNOSIS — J9611 Chronic respiratory failure with hypoxia: Secondary | ICD-10-CM | POA: Diagnosis present

## 2014-10-15 DIAGNOSIS — Z66 Do not resuscitate: Secondary | ICD-10-CM | POA: Diagnosis present

## 2014-10-15 DIAGNOSIS — W1811XA Fall from or off toilet without subsequent striking against object, initial encounter: Secondary | ICD-10-CM | POA: Diagnosis present

## 2014-10-15 DIAGNOSIS — Y92121 Bathroom in nursing home as the place of occurrence of the external cause: Secondary | ICD-10-CM | POA: Diagnosis not present

## 2014-10-15 DIAGNOSIS — I2781 Cor pulmonale (chronic): Secondary | ICD-10-CM | POA: Diagnosis present

## 2014-10-15 DIAGNOSIS — C20 Malignant neoplasm of rectum: Secondary | ICD-10-CM | POA: Diagnosis present

## 2014-10-15 DIAGNOSIS — I872 Venous insufficiency (chronic) (peripheral): Secondary | ICD-10-CM | POA: Diagnosis present

## 2014-10-15 DIAGNOSIS — F329 Major depressive disorder, single episode, unspecified: Secondary | ICD-10-CM | POA: Diagnosis present

## 2014-10-15 DIAGNOSIS — Z515 Encounter for palliative care: Secondary | ICD-10-CM

## 2014-10-15 DIAGNOSIS — I4891 Unspecified atrial fibrillation: Secondary | ICD-10-CM | POA: Diagnosis present

## 2014-10-15 DIAGNOSIS — I482 Chronic atrial fibrillation: Secondary | ICD-10-CM | POA: Diagnosis present

## 2014-10-15 DIAGNOSIS — Y92009 Unspecified place in unspecified non-institutional (private) residence as the place of occurrence of the external cause: Secondary | ICD-10-CM

## 2014-10-15 DIAGNOSIS — W19XXXA Unspecified fall, initial encounter: Secondary | ICD-10-CM

## 2014-10-15 HISTORY — DX: Do not resuscitate: Z66

## 2014-10-15 HISTORY — DX: Dependence on supplemental oxygen: Z99.81

## 2014-10-15 HISTORY — DX: Secondary malignant neoplasm of unspecified lung: C78.00

## 2014-10-15 HISTORY — DX: Malignant neoplasm of colon, unspecified: C18.9

## 2014-10-15 HISTORY — DX: Secondary malignant neoplasm of unspecified lung: C18.9

## 2014-10-15 LAB — BASIC METABOLIC PANEL
ANION GAP: 14 (ref 5–15)
BUN: 27 mg/dL — ABNORMAL HIGH (ref 6–23)
CO2: 31 mEq/L (ref 19–32)
Calcium: 9.9 mg/dL (ref 8.4–10.5)
Chloride: 96 mEq/L (ref 96–112)
Creatinine, Ser: 0.87 mg/dL (ref 0.50–1.10)
GFR calc Af Amer: 72 mL/min — ABNORMAL LOW (ref >90)
GFR calc non Af Amer: 62 mL/min — ABNORMAL LOW (ref >90)
Glucose, Bld: 134 mg/dL — ABNORMAL HIGH (ref 70–99)
Potassium: 4.2 mEq/L (ref 3.7–5.3)
Sodium: 141 mEq/L (ref 137–147)

## 2014-10-15 LAB — CBC WITH DIFFERENTIAL/PLATELET
BASOS ABS: 0 10*3/uL (ref 0.0–0.1)
Basophils Relative: 0 % (ref 0–1)
Eosinophils Absolute: 0.1 10*3/uL (ref 0.0–0.7)
Eosinophils Relative: 2 % (ref 0–5)
HEMATOCRIT: 45.5 % (ref 36.0–46.0)
Hemoglobin: 13.7 g/dL (ref 12.0–15.0)
LYMPHS PCT: 6 % — AB (ref 12–46)
Lymphs Abs: 0.5 10*3/uL — ABNORMAL LOW (ref 0.7–4.0)
MCH: 29.7 pg (ref 26.0–34.0)
MCHC: 30.1 g/dL (ref 30.0–36.0)
MCV: 98.7 fL (ref 78.0–100.0)
Monocytes Absolute: 0.7 10*3/uL (ref 0.1–1.0)
Monocytes Relative: 10 % (ref 3–12)
NEUTROS ABS: 5.9 10*3/uL (ref 1.7–7.7)
Neutrophils Relative %: 82 % — ABNORMAL HIGH (ref 43–77)
Platelets: 258 10*3/uL (ref 150–400)
RBC: 4.61 MIL/uL (ref 3.87–5.11)
RDW: 18.5 % — ABNORMAL HIGH (ref 11.5–15.5)
WBC: 7.2 10*3/uL (ref 4.0–10.5)

## 2014-10-15 LAB — PRO B NATRIURETIC PEPTIDE: Pro B Natriuretic peptide (BNP): 8287 pg/mL — ABNORMAL HIGH (ref 0–450)

## 2014-10-15 LAB — TROPONIN I: Troponin I: <0.3 ng/mL (ref <0.30)

## 2014-10-15 NOTE — ED Notes (Signed)
Pt cleared from LSB by Dr Stann Mainland and staff

## 2014-10-15 NOTE — ED Notes (Signed)
Reported pt fell in BR at [facility] unwitnessed, pt placed in c-collar and LSB, c/o left shoulder pain, unknown if pt hit head

## 2014-10-15 NOTE — H&P (Signed)
PCP:   Delphina Cahill, MD   Chief Complaint:  Fall  HPI: 80 year old female who  has a past medical history of Essential hypertension, benign; Permanent atrial fibrillation; Mild dementia; Mitral insufficiency; Tricuspid insufficiency; Pulmonary hypertension; Chronic venous insufficiency; History of nuclear stress test (12/12/05/2010); Depression; GI bleed; Rectal polyp; Colon cancer metastasized to lung; DNR (do not resuscitate); and On home O2. Patient has history of colorectal cancer with bilateral pulmonary metastasis. No further chemotherapy as per patient's oncologist at [facility]. Patient is currently enrolled in hospice at home. Today patient was brought to the ED after she fell at the assisted living facility. Patient is able to provide good history, she denies passing out, complains of shortness of breath but no chest pain. No nausea vomiting or diarrhea. In the ED chest x-ray showed large right pleural effusion. CT head shows hyperdense lesions in the left parietal lobe white matter adjacent to the ventricle most concerning for brain metastasis.  Allergies:  No Known Allergies    Past Medical History  Diagnosis Date  . Essential hypertension, benign   . Permanent atrial fibrillation   . Mild dementia   . Mitral insufficiency     Mild to moderate  . Tricuspid insufficiency     Moderate  . Pulmonary hypertension     Severe with RV dysfunction  . Chronic venous insufficiency   . History of nuclear stress test 12/12/05/2010    Lexiscan; non-diagnostic for ischemia, low risk   . Depression   . GI bleed     Xarelto stopped  . Rectal polyp     Tubulovillous adenoma with high-grade dysplasia but no evidence of invasion  . Colon cancer metastasized to lung   . DNR (do not resuscitate)   . On home O2     O2 2L N/C prn    Past Surgical History  Procedure Laterality Date  . Cholecystectomy  2006  . Total knee arthroplasty Left 2013  . Tonsillectomy    . Colonoscopy with  esophagogastroduodenoscopy (egd) N/A 06/22/2013    Procedure: COLONOSCOPY WITH ESOPHAGOGASTRODUODENOSCOPY (EGD);  Surgeon: Rogene Houston, MD;  Location: [facility];  Service: Endoscopy;  Laterality: N/A;  730  . Transthoracic echocardiogram  10/24/2012    EF=50-55%, mod conc LVH; RV mod-severely dilated & systolic function mildly reduced; LA severely dialted, possible small PFO; mild mitral annular calcif & mild MR; mod TR & elevated RV systolic pressure, mod pulm HTN; AV mildyl sclerotic & mild-mod regurg (performed for murmur & afib)  . Total knee arthroplasty Right 12/07/2013  . Flexible sigmoidoscopy N/A 03/14/2014    Procedure: FLEXIBLE SIGMOIDOSCOPY;  Surgeon: Rogene Houston, MD;  Location: [facility];  Service: Endoscopy;  Laterality: N/A;  730  . Polypectomy N/A 03/14/2014    Procedure: POLYPECTOMY;  Surgeon: Rogene Houston, MD;  Location: [facility];  Service: Endoscopy;  Laterality: N/A;    Prior to Admission medications   Medication Sig Start Date End Date Taking? Authorizing Provider  atenolol (TENORMIN) 50 MG tablet Take 50 mg by mouth daily.   Yes Historical Provider, MD  benzonatate (TESSALON) 200 MG capsule Take 200 mg by mouth 2 (two) times daily as needed for cough.   Yes Historical Provider, MD  digoxin (LANOXIN) 0.125 MG tablet Take 0.0625 mg by mouth daily.   Yes Historical Provider, MD  donepezil (ARICEPT) 10 MG tablet Take 10 mg by mouth at bedtime.   Yes Historical Provider, MD  DULoxetine (CYMBALTA) 30 MG capsule Take  30 mg by mouth daily.   Yes Historical Provider, MD  furosemide (LASIX) 40 MG tablet Take 1 tablet (40 mg total) by mouth daily. 08/19/14  Yes Estela Leonie Green, MD  Iron-FA-B Cmp-C-Biot-Probiotic (FUSION PLUS) CAPS Take 1 capsule by mouth daily.   Yes Historical Provider, MD  loperamide (IMODIUM) 2 MG capsule Take 2 mg by mouth as needed for diarrhea or loose stools.   Yes Historical Provider, MD  LORazepam (ATIVAN) 0.5 MG tablet Take 0.25  mg by mouth every 4 (four) hours as needed for anxiety.   Yes Historical Provider, MD  magnesium citrate SOLN Take 0.5 mLs by mouth daily.   Yes Historical Provider, MD  pantoprazole (PROTONIX) 40 MG tablet Take 40 mg by mouth daily.   Yes Historical Provider, MD  polyethylene glycol (MIRALAX / GLYCOLAX) packet Take 17 g by mouth daily.   Yes Historical Provider, MD  potassium chloride (K-DUR) 10 MEQ tablet Take 1 tablet (10 mEq total) by mouth daily. 08/19/14  Yes Estela Leonie Green, MD  prochlorperazine (COMPAZINE) 10 MG tablet Take 10 mg by mouth every 6 (six) hours as needed for nausea or vomiting.   Yes Historical Provider, MD  acetaminophen (TYLENOL) 500 MG tablet Take 500 mg by mouth every 6 (six) hours as needed for mild pain.     Historical Provider, MD  fluticasone (FLONASE) 50 MCG/ACT nasal spray Place 2 sprays into the nose daily as needed for allergies.     Historical Provider, MD    Social History:  reports that she has never smoked. She has never used smokeless tobacco. She reports that she does not drink alcohol or use illicit drugs.  Family History  Problem Relation Age of Onset  . Colon cancer Neg Hx      All the positives are listed in BOLD  Review of Systems:  HEENT: Headache, blurred vision,  Neck: Hypothyroidism, hyperthyroidism,,lymphadenopathy Chest : Shortness of breath Heart : Chest pain, history of coronary arterey disease GI:  Nausea, vomiting, diarrhea, constipation,  GU: Dysuria, urgency, frequency of urination, hematuria Neuro: Stroke, seizures, syncope    Physical Exam: Blood pressure 116/76, pulse 97, temperature 98.7 F (37.1 C), temperature source Oral, resp. rate 22, height 5\' 2"  (1.575 m), SpO2 93 %. Constitutional:   Patient is a well-developed and well-nourished *female in no acute distress and cooperative with exam. Head: Normocephalic and atraumatic Mouth: Mucus membranes moist Eyes: PERRL, EOMI, conjunctivae normal Neck: Supple,  No Thyromegaly Cardiovascular: RRR, S1 normal, S2 normal Pulmonary/Chest: decreased breath sounds bilaterally  Abdominal: Soft. Non-tender, non-distended, bowel sounds are normal, no masses, organomegaly, or guarding present.  Neurological: A&O x3, Strength is normal and symmetric bilaterally, cranial nerve II-XII are grossly intact, no focal motor deficit, sensory intact to light touch bilaterally.  Extremities : No Cyanosis, Clubbing or Edema  Labs on Admission:  Basic Metabolic Panel:  Recent Labs Lab 10/15/14 2208  NA 141  K 4.2  CL 96  CO2 31  GLUCOSE 134*  BUN 27*  CREATININE 0.87  CALCIUM 9.9   Liver Function Tests: No results for input(s): AST, ALT, ALKPHOS, BILITOT, PROT, ALBUMIN in the last 168 hours. No results for input(s): LIPASE, AMYLASE in the last 168 hours. No results for input(s): AMMONIA in the last 168 hours. CBC:  Recent Labs Lab 10/15/14 2208  WBC 7.2  NEUTROABS 5.9  HGB 13.7  HCT 45.5  MCV 98.7  PLT 258   Cardiac Enzymes:  Recent Labs Lab 10/15/14 2208  TROPONINI <0.30    BNP (last 3 results)  Recent Labs  04/06/14 0538 08/17/14 1855 10/15/14 2208  PROBNP 2663.0* 10010.0* 8287.0*   CBG: No results for input(s): GLUCAP in the last 168 hours.  Radiological Exams on Admission: Dg Chest 2 View  10/15/2014   CLINICAL DATA:  Initial evaluation for fall, trauma.  EXAM: CHEST  2 VIEW  COMPARISON:  Prior CT from 08/17/2014  FINDINGS: Cardiomegaly is stable from prior. Mediastinal silhouette within normal limits.  A large right pleural effusion extending of the lateral right hemi thorax with capping at the right lung apex is present. Hazy opacity throughout the right lung likely reflects effusion and/or atelectasis. Multiple bilateral pulmonary nodules are present, better evaluated on recent chest CT. The no pneumothorax. No definite pulmonary edema or focal infiltrate.  No acute osseus abnormality.  IMPRESSION: 1. No acute traumatic injury  within the chest. 2. Large right pleural effusion. Hazy opacity within the aerated portion of the right lung favored to reflect effusion and/or atelectasis. 3. Bilateral pulmonary nodules, grossly similar relative to prior CT. 4. Cardiomegaly.   Electronically Signed   By: Jeannine Boga M.D.   On: 10/15/2014 20:49   Dg Pelvis 1-2 Views  10/15/2014   CLINICAL DATA:  Initial evaluation for acute trauma.  Fall.  EXAM: PELVIS - 1-2 VIEW  COMPARISON:  None.  FINDINGS: No acute fracture or dislocation. The femoral heads are normally aligned within the acetabula. Femoral head heights are maintained. Advanced degenerative osteoarthrosis noted about both hips. Bony pelvis is intact. SI joints are approximated. No pubic diastasis.  No soft tissue abnormality.  Prominent degenerative changes noted within the lower lumbar spine parent  IMPRESSION: 1. No acute fracture or dislocation. 2. Advanced degenerative osteoarthrosis about the hips bilaterally.   Electronically Signed   By: Jeannine Boga M.D.   On: 10/15/2014 20:44   Ct Head Wo Contrast  10/15/2014   CLINICAL DATA:  Unwitnessed fall at nursing home. History of colon cancer with lung metastasis.  EXAM: CT HEAD WITHOUT CONTRAST  CT CERVICAL SPINE WITHOUT CONTRAST  TECHNIQUE: Multidetector CT imaging of the head and cervical spine was performed following the standard protocol without intravenous contrast. Multiplanar CT image reconstructions of the cervical spine were also generated.  COMPARISON:  CT thorax 08/17/2014  FINDINGS: CT HEAD FINDINGS  There is a round hyperdense 11 mm lesion in the a deep white matter of the left parietal lobe adjacent to the ventricle. This is felt to be within the brain parenchyma rather than the intraventricular lesion or epididymal lesion.  There is no intracranial hemorrhage. No CT evidence of acute cortical infarction. No midline shift or mass effect. There is generalized cortical atrophy and proportional ventricular  dilatation.  Basal cisterns are patent.  Paranasal sinuses and  mastoid air cells are clear.  CT CERVICAL SPINE FINDINGS  Exam is degraded by patient motion.  No prevertebral soft tissue swelling. Normal alignment of cervical vertebral bodies. No loss of vertebral body height. Normal facet articulation. Normal craniocervical junction.  There is multiple levels of endplate degeneration with spurring and joint space narrowing and sclerosis. There is no acute subluxation or fracture evident.  No evidence epidural or paraspinal hematoma.  Limited view of the lung apices demonstrates a pulmonary metastasis in left upper lobe. Large effusion on the right which is new from prior CT.  IMPRESSION: 1. Hyperdense lesion within the left parietal lobe white matter adjacent to the ventricle is most concerning for a brain metastasis.  This could be confirmed with non emergent MRI of the brain with contrast. 2. No intracranial trauma. 3. Atrophy and ventricular dilatation. 4. No evidence of cervical spine fracture on exam degraded by patient motion. 5. Multiple levels of degenerative endplate change. 6. Pulmonary metastasis and large new  right effusion. Findings conveyed toKATHLEEN MCMANUS on 10/15/2014  at21:26.   Electronically Signed   By: Suzy Bouchard M.D.   On: 10/15/2014 21:27   Ct Cervical Spine Wo Contrast  10/15/2014   CLINICAL DATA:  Unwitnessed fall at nursing home. History of colon cancer with lung metastasis.  EXAM: CT HEAD WITHOUT CONTRAST  CT CERVICAL SPINE WITHOUT CONTRAST  TECHNIQUE: Multidetector CT imaging of the head and cervical spine was performed following the standard protocol without intravenous contrast. Multiplanar CT image reconstructions of the cervical spine were also generated.  COMPARISON:  CT thorax 08/17/2014  FINDINGS: CT HEAD FINDINGS  There is a round hyperdense 11 mm lesion in the a deep white matter of the left parietal lobe adjacent to the ventricle. This is felt to be within the  brain parenchyma rather than the intraventricular lesion or epididymal lesion.  There is no intracranial hemorrhage. No CT evidence of acute cortical infarction. No midline shift or mass effect. There is generalized cortical atrophy and proportional ventricular dilatation.  Basal cisterns are patent.  Paranasal sinuses and  mastoid air cells are clear.  CT CERVICAL SPINE FINDINGS  Exam is degraded by patient motion.  No prevertebral soft tissue swelling. Normal alignment of cervical vertebral bodies. No loss of vertebral body height. Normal facet articulation. Normal craniocervical junction.  There is multiple levels of endplate degeneration with spurring and joint space narrowing and sclerosis. There is no acute subluxation or fracture evident.  No evidence epidural or paraspinal hematoma.  Limited view of the lung apices demonstrates a pulmonary metastasis in left upper lobe. Large effusion on the right which is new from prior CT.  IMPRESSION: 1. Hyperdense lesion within the left parietal lobe white matter adjacent to the ventricle is most concerning for a brain metastasis. This could be confirmed with non emergent MRI of the brain with contrast. 2. No intracranial trauma. 3. Atrophy and ventricular dilatation. 4. No evidence of cervical spine fracture on exam degraded by patient motion. 5. Multiple levels of degenerative endplate change. 6. Pulmonary metastasis and large new  right effusion. Findings conveyed toKATHLEEN MCMANUS on 10/15/2014  at21:26.   Electronically Signed   By: Suzy Bouchard M.D.   On: 10/15/2014 21:27   Dg Shoulder Left  10/15/2014   CLINICAL DATA:  Patient status post fall.  EXAM: LEFT SHOULDER - 2+ VIEW  COMPARISON:  None.  FINDINGS: Examination limited secondary to difficulty with patient positioning. Within the above limitation, no definite evidence for acute fracture or dislocation. AC joint degenerative change. Re- demonstrated in 1.2 cm left upper lobe nodule, visualized on  prior chest CT 08/17/2014.  IMPRESSION: Limited exam.  No definite evidence for acute fracture or dislocation.   Electronically Signed   By: Lovey Newcomer M.D.   On: 10/15/2014 20:50    EKG: Independently reviewed. * atrial fibrillation   Assessment/Plan Principal Problem:   Pleural effusion Active Problems:   Dementia   Adenocarcinoma of rectum  Pleural effusion Chest x-ray shows right large pleural effusion. Will admit the patient for therapeutic thoracentesis. We'll consult IR for thoracentesis in a.m.  Acute on chronic respiratory failure Multifactorial as patient has history of cor pulmonale, pulmonary hypertension and now has  pulmonary metastasis. Continue Lasix 40 g daily. Thoracentesis for pleural effusion in a.m.  History of colorectal cancer, with metastases to brain and lungs Patient is currently enrolled in hospice. No further treatment plans as per patient and her healthcare power of attorney.  Dementia  continue Aricept   Atrial fibrillation Continue digoxin,atenolol. No anticoagulation.   Code status: DNR  Family discussion: Admission, patients condition and plan of care including tests being ordered have been discussed with the patient and her Alexander * who indicate understanding and agree with the plan and Code Status.   Time Spent on Admission: 60 min  [facility] Pager: [phone] 10/15/2014, 11:55 PM  If 7PM-7AM, please contact night-coverage  [email or web address]  Password [email or web address]

## 2014-10-15 NOTE — ED Notes (Signed)
Contact number Jasmine December Cell 336 [phone]

## 2014-10-15 NOTE — ED Notes (Signed)
Pt alert and oriented to place, month and year

## 2014-10-15 NOTE — ED Provider Notes (Signed)
11:00 PM  Assumed care from Dr. Thurnell Garbe.  Pt is a 80 y.o. female with history of colon cancer who is followed by [facility] oncology who presents emergency department with an unwitnessed fall at [place]. She also has a history of dementia, atrial fibrillation and hypertension. CT of her head and cervical spine show no acute injury. Chest x-ray does show a large right-sided pleural effusion. When she came to the emergency department she was hypoxic in the 80s but this is improved with oxygen. She is on oxygen at home but only intermittently. Patient will need admission for [facility] centesis. Labs unremarkable other than elevated BNP. Troponin negative. Will discuss with hospitalist for admission.  Peck, DO 10/15/14 2314

## 2014-10-15 NOTE — ED Provider Notes (Signed)
CSN: [id number]     Arrival date & time 10/15/14  1904 History   First MD Initiated Contact with Patient 10/15/14 1912     Chief Complaint  Patient presents with  . Fall      [name] presenting with fall. The history is provided by the patient, the EMS personnel and the nursing home. The history is limited by the condition of the patient (Hx dementia).  Fall  Pt was seen at [place]. Per EMS, NH report and pt: pt c/o unwitnessed fall in the bathroom at [facility]. Pt states she fell while trying to get on the commode. Pt states she "thinks I fell to the side;" is unsure if she hit her head. Pt told EMS she had left shoulder pain, and told her family she "passed out." Pt denies any complaints on arrival to the ED. Denies head pain, no neck or back pain, no CP/SOB, no abd pain, no focal motor weakness, no tingling/numbness in extremities.      Past Medical History  Diagnosis Date  . Essential hypertension, benign   . Permanent atrial fibrillation   . Mild dementia   . Mitral insufficiency     Mild to moderate  . Tricuspid insufficiency     Moderate  . Pulmonary hypertension     Severe with RV dysfunction  . Chronic venous insufficiency   . History of nuclear stress test 12/12/05/2010    Lexiscan; non-diagnostic for ischemia, low risk   . Depression   . GI bleed     Xarelto stopped  . 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 Cholecystectomy  2006  . Total knee arthroplasty Left 2013  . Tonsillectomy    . Colonoscopy with esophagogastroduodenoscopy (egd) N/A 06/22/2013    Procedure: COLONOSCOPY WITH ESOPHAGOGASTRODUODENOSCOPY (EGD);  Surgeon: Rogene Houston, MD;  Location: [facility];  Service: Endoscopy;  Laterality: N/A;  730  . Transthoracic echocardiogram  10/24/2012    EF=50-55%, mod conc LVH; RV mod-severely dilated & systolic function  mildly reduced; LA severely dialted, possible small PFO; mild mitral annular calcif & mild MR; mod TR & elevated RV systolic pressure, mod pulm HTN; AV mildyl sclerotic & mild-mod regurg (performed for murmur & afib)  . Total knee arthroplasty Right 12/07/2013  . Flexible sigmoidoscopy N/A 03/14/2014    Procedure: FLEXIBLE SIGMOIDOSCOPY;  Surgeon: Rogene Houston, MD;  Location: [facility];  Service: Endoscopy;  Laterality: N/A;  730  . Polypectomy N/A 03/14/2014    Procedure: POLYPECTOMY;  Surgeon: Rogene Houston, MD;  Location: [facility];  Service: Endoscopy;  Laterality: N/A;   Family History  Problem Relation Age of Onset  . Colon cancer Neg Hx    History  Substance Use Topics  . Smoking status: Never Smoker   . Smokeless tobacco: Never Used  . Alcohol Use: No    Review of Systems  Unable to perform ROS: Dementia      Allergies  Review of patient's allergies indicates no known allergies.  Home Medications   Prior to Admission medications   Medication Sig Start Date End Date Taking? Authorizing Provider  acetaminophen (TYLENOL) 500 MG tablet Take 500 mg by mouth every 6 (six) hours as needed for mild pain.     Historical Provider, MD  atenolol (TENORMIN) 50 MG tablet Take 50 mg by mouth daily.  Historical Provider, MD  digoxin (LANOXIN) 0.125 MG tablet Take 0.0625 mg by mouth daily.    Historical Provider, MD  donepezil (ARICEPT) 10 MG tablet Take 10 mg by mouth at bedtime.    Historical Provider, MD  DULoxetine (CYMBALTA) 30 MG capsule Take 30 mg by mouth daily.    Historical Provider, MD  fluticasone (FLONASE) 50 MCG/ACT nasal spray Place 2 sprays into the nose daily as needed for allergies.     Historical Provider, MD  furosemide (LASIX) 40 MG tablet Take 1 tablet (40 mg total) by mouth daily. 08/19/14   Erline Hau, MD  pantoprazole (PROTONIX) 40 MG tablet Take 40 mg by mouth daily.    Historical Provider, MD  polyethylene glycol (MIRALAX / GLYCOLAX)  packet Take 17 g by mouth daily.    Historical Provider, MD  potassium chloride (K-DUR) 10 MEQ tablet Take 1 tablet (10 mEq total) by mouth daily. 08/19/14   Erline Hau, MD   BP 127/83 mmHg  Pulse 96  Temp(Src) 98.7 F (37.1 C) (Oral)  Resp 28  Ht 5\' 2"  (1.575 m)  SpO2 94% Physical Exam  1940: Physical examination: Vital signs and O2 SAT: Reviewed; Constitutional: Well developed, Well nourished, In no acute distress; Head and Face: Normocephalic, Atraumatic; Eyes: EOMI, PERRL, No scleral icterus; ENMT: Mouth and pharynx normal, Left TM normal, Right TM normal, Mucous membranes dry; Neck: Immobilized in C-collar, Trachea midline; Spine: Immobilized on spineboard, No midline CS, TS, LS tenderness.; Cardiovascular: Regular rate and rhythm, No gallop; Respiratory: Breath sounds coarse right & clear left, No wheezes, Normal respiratory effort/excursion; Chest: Nontender, No deformity, Movement normal, No crepitus, No abrasions or ecchymosis.; Abdomen: Soft, Nontender, Nondistended, Normal bowel sounds, No abrasions or ecchymosis.; Genitourinary: No CVA tenderness;; Extremities: No deformity, Full range of motion major/large joints of bilat UE's and LE's without pain or tenderness to palp, Neurovascularly intact, Pulses normal, No tenderness, No edema, Pelvis stable; Neuro: Awake, alert, confused per hx dementia. No facial droop. Major CN grossly intact. Speech clear. No gross focal motor or sensory deficits in extremities.; Skin: Color normal, Warm, Dry   ED Course  Procedures     MDM  MDM Reviewed: previous chart, nursing note and vitals Interpretation: x-ray and CT scan   Dg Chest 2 View 10/15/2014   CLINICAL DATA:  Initial evaluation for fall, trauma.  EXAM: CHEST  2 VIEW  COMPARISON:  Prior CT from 08/17/2014  FINDINGS: Cardiomegaly is stable from prior. Mediastinal silhouette within normal limits.  A large right pleural effusion extending of the lateral right hemi thorax  with capping at the right lung apex is present. Hazy opacity throughout the right lung likely reflects effusion and/or atelectasis. Multiple bilateral pulmonary nodules are present, better evaluated on recent chest CT. The no pneumothorax. No definite pulmonary edema or focal infiltrate.  No acute osseus abnormality.  IMPRESSION: 1. No acute traumatic injury within the chest. 2. Large right pleural effusion. Hazy opacity within the aerated portion of the right lung favored to reflect effusion and/or atelectasis. 3. Bilateral pulmonary nodules, grossly similar relative to prior CT. 4. Cardiomegaly.   Electronically Signed   By: Jeannine Boga M.D.   On: 10/15/2014 20:49   Dg Pelvis 1-2 Views 10/15/2014   CLINICAL DATA:  Initial evaluation for acute trauma.  Fall.  EXAM: PELVIS - 1-2 VIEW  COMPARISON:  None.  FINDINGS: No acute fracture or dislocation. The femoral heads are normally aligned within the acetabula. Femoral head heights are  maintained. Advanced degenerative osteoarthrosis noted about both hips. Bony pelvis is intact. SI joints are approximated. No pubic diastasis.  No soft tissue abnormality.  Prominent degenerative changes noted within the lower lumbar spine parent  IMPRESSION: 1. No acute fracture or dislocation. 2. Advanced degenerative osteoarthrosis about the hips bilaterally.   Electronically Signed   By: Jeannine Boga M.D.   On: 10/15/2014 20:44   Ct Head Wo Contrast 10/15/2014   CLINICAL DATA:  Unwitnessed fall at nursing home. History of colon cancer with lung metastasis.  EXAM: CT HEAD WITHOUT CONTRAST  CT CERVICAL SPINE WITHOUT CONTRAST  TECHNIQUE: Multidetector CT imaging of the head and cervical spine was performed following the standard protocol without intravenous contrast. Multiplanar CT image reconstructions of the cervical spine were also generated.  COMPARISON:  CT thorax 08/17/2014  FINDINGS: CT HEAD FINDINGS  There is a round hyperdense 11 mm lesion in the a deep  white matter of the left parietal lobe adjacent to the ventricle. This is felt to be within the brain parenchyma rather than the intraventricular lesion or epididymal lesion.  There is no intracranial hemorrhage. No CT evidence of acute cortical infarction. No midline shift or mass effect. There is generalized cortical atrophy and proportional ventricular dilatation.  Basal cisterns are patent.  Paranasal sinuses and  mastoid air cells are clear.  CT CERVICAL SPINE FINDINGS  Exam is degraded by patient motion.  No prevertebral soft tissue swelling. Normal alignment of cervical vertebral bodies. No loss of vertebral body height. Normal facet articulation. Normal craniocervical junction.  There is multiple levels of endplate degeneration with spurring and joint space narrowing and sclerosis. There is no acute subluxation or fracture evident.  No evidence epidural or paraspinal hematoma.  Limited view of the lung apices demonstrates a pulmonary metastasis in left upper lobe. Large effusion on the right which is new from prior CT.  IMPRESSION: 1. Hyperdense lesion within the left parietal lobe white matter adjacent to the ventricle is most concerning for a brain metastasis. This could be confirmed with non emergent MRI of the brain with contrast. 2. No intracranial trauma. 3. Atrophy and ventricular dilatation. 4. No evidence of cervical spine fracture on exam degraded by patient motion. 5. Multiple levels of degenerative endplate change. 6. Pulmonary metastasis and large new  right effusion. Findings conveyed toKATHLEEN Viviane Semidey on 10/15/2014  at21:26.   Electronically Signed   By: Suzy Bouchard M.D.   On: 10/15/2014 21:27   Ct Cervical Spine Wo Contrast 10/15/2014   CLINICAL DATA:  Unwitnessed fall at nursing home. History of colon cancer with lung metastasis.  EXAM: CT HEAD WITHOUT CONTRAST  CT CERVICAL SPINE WITHOUT CONTRAST  TECHNIQUE: Multidetector CT imaging of the head and cervical spine was performed  following the standard protocol without intravenous contrast. Multiplanar CT image reconstructions of the cervical spine were also generated.  COMPARISON:  CT thorax 08/17/2014  FINDINGS: CT HEAD FINDINGS  There is a round hyperdense 11 mm lesion in the a deep white matter of the left parietal lobe adjacent to the ventricle. This is felt to be within the brain parenchyma rather than the intraventricular lesion or epididymal lesion.  There is no intracranial hemorrhage. No CT evidence of acute cortical infarction. No midline shift or mass effect. There is generalized cortical atrophy and proportional ventricular dilatation.  Basal cisterns are patent.  Paranasal sinuses and  mastoid air cells are clear.  CT CERVICAL SPINE FINDINGS  Exam is degraded by patient motion.  No prevertebral  soft tissue swelling. Normal alignment of cervical vertebral bodies. No loss of vertebral body height. Normal facet articulation. Normal craniocervical junction.  There is multiple levels of endplate degeneration with spurring and joint space narrowing and sclerosis. There is no acute subluxation or fracture evident.  No evidence epidural or paraspinal hematoma.  Limited view of the lung apices demonstrates a pulmonary metastasis in left upper lobe. Large effusion on the right which is new from prior CT.  IMPRESSION: 1. Hyperdense lesion within the left parietal lobe white matter adjacent to the ventricle is most concerning for a brain metastasis. This could be confirmed with non emergent MRI of the brain with contrast. 2. No intracranial trauma. 3. Atrophy and ventricular dilatation. 4. No evidence of cervical spine fracture on exam degraded by patient motion. 5. Multiple levels of degenerative endplate change. 6. Pulmonary metastasis and large new  right effusion. Findings conveyed toKATHLEEN Cortavius Montesinos on 10/15/2014  at21:26.   Electronically Signed   By: Suzy Bouchard M.D.   On: 10/15/2014 21:27   Dg Shoulder Left 10/15/2014    CLINICAL DATA:  Patient status post fall.  EXAM: LEFT SHOULDER - 2+ VIEW  COMPARISON:  None.  FINDINGS: Examination limited secondary to difficulty with patient positioning. Within the above limitation, no definite evidence for acute fracture or dislocation. AC joint degenerative change. Re- demonstrated in 1.2 cm left upper lobe nodule, visualized on prior chest CT 08/17/2014.  IMPRESSION: Limited exam.  No definite evidence for acute fracture or dislocation.   Electronically Signed   By: Lovey Newcomer M.D.   On: 10/15/2014 20:50    2225:  Pt arrived to ED with LSB and c-collar in place.  Multiple ED staff at bedside to log roll pt off LSB while maintaining cervical spinal immobilization.  LSB removed, c-collar remained in place while workup progressed. CT-CS reassuring. No midline CS tenderness, FROM CS without midline tenderness. No NMS changes.  C-collar removed.  Labs pending. Pt will need to be admitted for new right pleural effusion. Sign out to Dr. Leonides Schanz.      Francine Graven, DO 10/17/14 1510

## 2014-10-16 ENCOUNTER — Encounter (HOSPITAL_COMMUNITY): Payer: Self-pay

## 2014-10-16 DIAGNOSIS — C7931 Secondary malignant neoplasm of brain: Secondary | ICD-10-CM | POA: Insufficient documentation

## 2014-10-16 DIAGNOSIS — C20 Malignant neoplasm of rectum: Secondary | ICD-10-CM

## 2014-10-16 DIAGNOSIS — W19XXXA Unspecified fall, initial encounter: Secondary | ICD-10-CM

## 2014-10-16 DIAGNOSIS — J90 Pleural effusion, not elsewhere classified: Secondary | ICD-10-CM | POA: Diagnosis present

## 2014-10-16 DIAGNOSIS — Y92009 Unspecified place in unspecified non-institutional (private) residence as the place of occurrence of the external cause: Secondary | ICD-10-CM

## 2014-10-16 DIAGNOSIS — J9611 Chronic respiratory failure with hypoxia: Secondary | ICD-10-CM

## 2014-10-16 DIAGNOSIS — Y92099 Unspecified place in other non-institutional residence as the place of occurrence of the external cause: Secondary | ICD-10-CM

## 2014-10-16 DIAGNOSIS — J948 Other specified pleural conditions: Secondary | ICD-10-CM

## 2014-10-16 LAB — MRSA PCR SCREENING: MRSA BY PCR: NEGATIVE

## 2014-10-16 LAB — COMPREHENSIVE METABOLIC PANEL
ALBUMIN: 2.4 g/dL — AB (ref 3.5–5.2)
ALT: 21 U/L (ref 0–35)
AST: 59 U/L — ABNORMAL HIGH (ref 0–37)
Alkaline Phosphatase: 249 U/L — ABNORMAL HIGH (ref 39–117)
Anion gap: 12 (ref 5–15)
BUN: 28 mg/dL — ABNORMAL HIGH (ref 6–23)
CHLORIDE: 100 meq/L (ref 96–112)
CO2: 32 mEq/L (ref 19–32)
CREATININE: 0.8 mg/dL (ref 0.50–1.10)
Calcium: 9.6 mg/dL (ref 8.4–10.5)
GFR calc Af Amer: 80 mL/min — ABNORMAL LOW (ref >90)
GFR calc non Af Amer: 69 mL/min — ABNORMAL LOW (ref >90)
Glucose, Bld: 109 mg/dL — ABNORMAL HIGH (ref 70–99)
Potassium: 4.4 mEq/L (ref 3.7–5.3)
SODIUM: 144 meq/L (ref 137–147)
Total Bilirubin: 1.6 mg/dL — ABNORMAL HIGH (ref 0.3–1.2)
Total Protein: 6.2 g/dL (ref 6.0–8.3)

## 2014-10-16 LAB — CBC
HCT: 43.9 % (ref 36.0–46.0)
Hemoglobin: 13.5 g/dL (ref 12.0–15.0)
MCH: 30.3 pg (ref 26.0–34.0)
MCHC: 30.8 g/dL (ref 30.0–36.0)
MCV: 98.7 fL (ref 78.0–100.0)
PLATELETS: 290 10*3/uL (ref 150–400)
RBC: 4.45 MIL/uL (ref 3.87–5.11)
RDW: 18.5 % — AB (ref 11.5–15.5)
WBC: 8.6 10*3/uL (ref 4.0–10.5)

## 2014-10-16 LAB — APTT: APTT: 29 s (ref 24–37)

## 2014-10-16 LAB — PROTIME-INR
INR: 1.24 (ref 0.00–1.49)
PROTHROMBIN TIME: 15.7 s — AB (ref 11.6–15.2)

## 2014-10-16 MED ORDER — POLYETHYLENE GLYCOL 3350 17 G PO PACK
17.0000 g | PACK | Freq: Every day | ORAL | Status: DC
  Administered 2014-10-16 – 2014-10-17 (×2): 17 g via ORAL
  Filled 2014-10-16 (×2): qty 1

## 2014-10-16 MED ORDER — ACETAMINOPHEN 650 MG RE SUPP: 650.0000 mg | Freq: Four times a day (QID) | RECTAL | Status: DC | PRN

## 2014-10-16 MED ORDER — DULOXETINE HCL 30 MG PO CPEP
30.0000 mg | ORAL_CAPSULE | Freq: Every day | ORAL | Status: DC
  Administered 2014-10-16 – 2014-10-17 (×2): 30 mg via ORAL
  Filled 2014-10-16 (×2): qty 1

## 2014-10-16 MED ORDER — ATENOLOL 25 MG PO TABS
50.0000 mg | ORAL_TABLET | Freq: Every day | ORAL | Status: DC
  Administered 2014-10-16 – 2014-10-17 (×2): 50 mg via ORAL
  Filled 2014-10-16 (×2): qty 2

## 2014-10-16 MED ORDER — POTASSIUM CHLORIDE CRYS ER 10 MEQ PO TBCR
10.0000 meq | EXTENDED_RELEASE_TABLET | Freq: Every day | ORAL | Status: DC
  Administered 2014-10-16 – 2014-10-17 (×2): 10 meq via ORAL
  Filled 2014-10-16 (×4): qty 1

## 2014-10-16 MED ORDER — FUROSEMIDE 40 MG PO TABS
40.0000 mg | ORAL_TABLET | Freq: Every day | ORAL | Status: DC
  Administered 2014-10-16 – 2014-10-17 (×2): 40 mg via ORAL
  Filled 2014-10-16 (×2): qty 1

## 2014-10-16 MED ORDER — ENSURE COMPLETE PO LIQD
237.0000 mL | Freq: Two times a day (BID) | ORAL | Status: DC
  Administered 2014-10-16 – 2014-10-17 (×3): 237 mL via ORAL

## 2014-10-16 MED ORDER — ONDANSETRON HCL 4 MG/2ML IJ SOLN: 4.0000 mg | Freq: Four times a day (QID) | INTRAMUSCULAR | Status: DC | PRN

## 2014-10-16 MED ORDER — ONDANSETRON HCL 4 MG PO TABS
4.0000 mg | ORAL_TABLET | Freq: Four times a day (QID) | ORAL | Status: DC | PRN
Start: 2014-10-16 — End: 2014-10-17

## 2014-10-16 MED ORDER — PANTOPRAZOLE SODIUM 40 MG PO TBEC
40.0000 mg | DELAYED_RELEASE_TABLET | Freq: Every day | ORAL | Status: DC
  Administered 2014-10-16 – 2014-10-17 (×2): 40 mg via ORAL
  Filled 2014-10-16 (×2): qty 1

## 2014-10-16 MED ORDER — ALBUTEROL SULFATE (2.5 MG/3ML) 0.083% IN NEBU: 2.5000 mg | INHALATION_SOLUTION | RESPIRATORY_TRACT | Status: DC | PRN

## 2014-10-16 MED ORDER — ACETAMINOPHEN 325 MG PO TABS: 650.0000 mg | ORAL_TABLET | Freq: Four times a day (QID) | ORAL | Status: DC | PRN

## 2014-10-16 MED ORDER — BENZONATATE 100 MG PO CAPS: 200.0000 mg | ORAL_CAPSULE | Freq: Two times a day (BID) | ORAL | Status: DC | PRN

## 2014-10-16 MED ORDER — LORAZEPAM 0.5 MG PO TABS: 0.2500 mg | ORAL_TABLET | ORAL | Status: DC | PRN

## 2014-10-16 MED ORDER — DONEPEZIL HCL 5 MG PO TABS
10.0000 mg | ORAL_TABLET | Freq: Every day | ORAL | Status: DC
  Administered 2014-10-16 (×2): 10 mg via ORAL
  Filled 2014-10-16 (×2): qty 2

## 2014-10-16 MED ORDER — DIGOXIN 125 MCG PO TABS
0.0625 mg | ORAL_TABLET | Freq: Every day | ORAL | Status: DC
  Administered 2014-10-16 – 2014-10-17 (×2): 0.0625 mg via ORAL
  Filled 2014-10-16 (×2): qty 1

## 2014-10-16 MED ORDER — ALBUTEROL SULFATE (2.5 MG/3ML) 0.083% IN NEBU
2.5000 mg | INHALATION_SOLUTION | Freq: Three times a day (TID) | RESPIRATORY_TRACT | Status: DC
  Administered 2014-10-16 – 2014-10-17 (×2): 2.5 mg via RESPIRATORY_TRACT
  Filled 2014-10-16 (×4): qty 3

## 2014-10-16 MED ORDER — SODIUM CHLORIDE 0.9 % IJ SOLN
3.0000 mL | Freq: Two times a day (BID) | INTRAMUSCULAR | Status: DC
  Administered 2014-10-16 (×3): 3 mL via INTRAVENOUS

## 2014-10-16 MED ORDER — SODIUM CHLORIDE 0.9 % IJ SOLN: 3.0000 mL | INTRAMUSCULAR | Status: DC | PRN

## 2014-10-16 MED ORDER — ACETAMINOPHEN 500 MG PO TABS: 500.0000 mg | ORAL_TABLET | Freq: Four times a day (QID) | ORAL | Status: DC | PRN

## 2014-10-16 MED ORDER — SODIUM CHLORIDE 0.9 % IV SOLN: 250.0000 mL | INTRAVENOUS | Status: DC | PRN

## 2014-10-16 NOTE — Progress Notes (Signed)
PROGRESS NOTE  Tamara Mitchell VOH:[id number] DOB: [birth year] DOA: 10/15/2014 PCP: Delphina Cahill, MD  Summary: 80 year old woman with history of metastatic colon cancer, chronic hypoxic respiratory failure presenting to the emergency department after history of fall. Some conflicting documentation on admission estimated the patient may have passed out or struck her head. Imaging revealed metastatic disease to the brain and large right-sided pleural effusion. She was admitted for further evaluation of pleural effusion.  Assessment/Plan: 1. Large right pleural effusion. Mildly symptomatic. No respiratory distress at this point. Plan for therapeutic thoracentesis in the morning. Fall prior to admission. Patient reports falling in the bathroom. She denies syncope. No evidence of bony injury. 2. Chronic hypoxic respiratory failure; multifactorial, metastatic disease to the lung, pleural effusion, pulmonary hypertension, cor pulmonale. 3. History of colon cancer with metastasis to the lung and brain, followed by oncology in [place]. No further treatment plan. Patient enrolled in hospice. 4. Atrial fibrillation. Continue digoxin and atenolol. No anticoagulation. 5. Dementia. Stable. Continue Aricept.   Overall mildly symptomatic. Monitor today. Continue oxygen. Plan for therapeutic thoracentesis in the morning.  Likely home in the next 24 hours.  Code Status: DNR, enrolled in outpatient hospice. DVT prophylaxis: SCDs Family Communication: cousin Disposition Plan: home with hospice likely 12/21.  Murray Hodgkins, MD  [facility]  Pager [phone] If 7PM-7AM, please contact night-coverage at [email or web address], password [facility] 10/16/2014, 1:36 PM  LOS: 1 day   Consultants:    Procedures:    Antibiotics:    HPI/Subjective: Overall doing well.  Eating: very well Sleeping: fine GI: no n/v Pain: none Breathing: fine, no complaints Desires: none  Objective: Filed Vitals:   10/16/14 0000  10/16/14 0015 10/16/14 0053 10/16/14 0658  BP: 105/65  120/84 143/82  Pulse: 88 97 90 100  Temp:   98.4 F (36.9 C) 98.3 F (36.8 C)  TempSrc:   Oral Oral  Resp: 18 15  16   Height:   5\' 2"  (1.575 m)   Weight:   57.38 kg (126 lb 8 oz)   SpO2: 97% 96% 97% 95%    Intake/Output Summary (Last 24 hours) at 10/16/14 1336 Last data filed at 10/16/14 0900  Gross per 24 hour  Intake    120 ml  Output      0 ml  Net    120 ml     Filed Weights   10/16/14 0053  Weight: 57.38 kg (126 lb 8 oz)    Exam:     Afebrile, vital signs are stable. Respiratory rate 16. SPO2 95% on 2 L.  General: Appears calm, comfortable. Speech fluent and clear. Mildly short of breath   CV: Irregular, normal rate. No murmur, rub or gallop. No lower extremity edema.  Respiratory: Diminished breath sounds on the right but still good air movement. No frank wheezes, rales or rhonchi. Mild increased respiratory effort. Able to speak in full sentences.  Data Reviewed:  Left shoulder film unremarkable.  Plain films of the pelvis unremarkable.   Basic metabolic panel unremarkable. Minimal elevation of total bilirubin, alkaline phosphatase is been seen in the past.  Troponin was negative. BNP was 8000, improved compared to previous values in October.  CBC was unremarkable.  Pertinent data:  12/19 CT head, cervical spine: Lesion within the left parietal lobe concerning for brain metastasis. No intracranial trauma. No evidence of cervical spine fracture. Pulmonary metastasis and new large right pleural effusion.  12/19 chest x-ray: Large right pleural effusion. Bilateral pulmonary nodules.  Scheduled Meds: . atenolol  50 mg Oral Daily  . digoxin  0.0625 mg Oral Daily  . donepezil  10 mg Oral QHS  . DULoxetine  30 mg Oral Daily  . feeding supplement (ENSURE COMPLETE)  237 mL Oral BID BM  . furosemide  40 mg Oral Daily  . pantoprazole  40 mg Oral Daily  . polyethylene glycol  17 g Oral Daily  .  potassium chloride  10 mEq Oral Daily  . sodium chloride  3 mL Intravenous Q12H   Continuous Infusions:   Principal Problem:   Pleural effusion, right Active Problems:   Atrial fibrillation   Dementia   Cor pulmonale   Adenocarcinoma of rectum   Fall at home   Time spent 20 minutes

## 2014-10-16 NOTE — Progress Notes (Signed)
IP visit [facility] Rm 332-[facility] RN  Patient admitted for Pleural Effusion. Related admission to her hospice diagnosis of CA Colon mets to Lungs. Patient is a DNR. Patient is alert and oriented eating lunch on visit. patient states that she had a fall last night and today she is comfortable and has no pain at this time. Spoke with the nurse who states that patient will most likely have a thorancentisis tomorrow. Patient appears to have some labored breathing but seems comfortable on the oxygen. Cousin is at bedside, no concerns at this time.   Please call with any hospice concerns [phone] Thank you Ardath Sax RN

## 2014-10-16 NOTE — ED Notes (Signed)
Report given to Victoria RN

## 2014-10-17 ENCOUNTER — Inpatient Hospital Stay (HOSPITAL_COMMUNITY)

## 2014-10-17 DIAGNOSIS — F0390 Unspecified dementia without behavioral disturbance: Secondary | ICD-10-CM

## 2014-10-17 NOTE — Care Management Note (Signed)
    Page 1 of 1   10/17/2014     2:06:33 PM CARE MANAGEMENT NOTE 10/17/2014  Patient:  Tamara Mitchell,Tamara Mitchell   Account Number:  [id number]  Date Initiated:  10/17/2014  Documentation initiated by:  Theophilus Kinds  Subjective/Objective Assessment:   Pt admitted from [facility] with pleural effusions. Pt will return to facility at discharge. Pt is active with [facility].     Action/Plan:   CSW to arrange discharge to facility today after thoracentesis.   Anticipated DC Date:  10/17/2014   Anticipated DC Plan:  ASSISTED LIVING / REST HOME  In-house referral  Clinical Social Worker      DC Planning Services  CM consult      Choice offered to / List presented to:             Status of service:  Completed, signed off Medicare Important Message given?   (If response is "NO", the following Medicare IM given date fields will be blank) Date Medicare IM given:   Medicare IM given by:   Date Additional Medicare IM given:   Additional Medicare IM given by:    Discharge Disposition:  ASSISTED LIVING  Per UR Regulation:    If discussed at Long Length of Stay Meetings, dates discussed:    Comments:  10/17/14 [place], RN BSN CM

## 2014-10-17 NOTE — Progress Notes (Signed)
UR chart review completed.  

## 2014-10-17 NOTE — Progress Notes (Signed)
Inpatient RN visit- Shayleen Eppinger [facility] Room 332-[facility] RN Visit-Karen Alford [place] RN  Related admission to [facility] diagnosis of Rectal Ca w/lung mets.  Pt is DNR  code.  Pt admitted on 12/19 after a fall at her ALF. She was found to have a R pleural effusion. Pt alert & oriented sitting up in the recliner, appears dyspneic but denies any discomfort or increased work of breathing, O2 in place at 3.5 Liters [place]. Nonproductive cough noted. Breakfast tray at bedside, patient ate aprox 25%.  Bethena Roys shared that Mrs. Pocock is usually very animated and "loves to cut up", during visit she was drowsy, but able to engage when spoken to. Patient's cousin Bethena Roys present during visit. Per chart review, staff RN Jenny Reichmann and patient, plan is for a theraputic thoracentesis of right pleural effusion this afternoon with a possible d/c back to [facility]F after procedure if she is stable. CSW [place] in during visit to confirm plan, Nanine Means to provide transport, CSW to arrange.  [facility] will continue to follow, team made aware of possible d/c today.  Patient's home medication list and transfer summary in place on shadow chart.  Please call [facility] @ [phone]-with any hospice needs.   Thank you. Tracey Harries, RN BSN, [place] Liaison  [phone])

## 2014-10-17 NOTE — Clinical Social Work Note (Signed)
CSW spoke with pt's cousin, Bethena Roys at bedside. She wanted to make sure ALF was appropriate level of care for pt. CSW discussed PT evaluation and that Nanine Means felt comfortable with return. Bethena Roys appeared relieved. Possible d/c this afternoon.   Benay Pike, [place]

## 2014-10-17 NOTE — Progress Notes (Signed)
Nutrition Brief Note  Patient identified on the Malnutrition Screening Tool (MST) Report  Wt Readings from Last 15 Encounters:  10/16/14 126 lb 8 oz (57.38 kg)  08/17/14 150 lb 2.1 oz (68.1 kg)  08/09/14 152 lb 8 oz (69.174 kg)  05/06/14 143 lb (64.864 kg)  04/07/14 147 lb 11.3 oz (67 kg)  03/14/14 145 lb (65.772 kg)  02/14/14 149 lb 4.8 oz (67.722 kg)  02/11/14 147 lb 1.6 oz (66.724 kg)  02/03/14 145 lb (65.772 kg)  07/29/13 144 lb 6.4 oz (65.499 kg)  06/22/13 143 lb (64.864 kg)  06/17/13 143 lb 12.8 oz (65.79 kg)   80 year old woman with history of metastatic colon cancer, chronic hypoxic respiratory failure presenting to the emergency department after history of fall. Some conflicting documentation on admission estimated the patient may have passed out or struck her head. Imaging revealed metastatic disease to the brain and large right-sided pleural effusion. She was admitted for further evaluation of pleural effusion.  Pt is a resident of [facility] who is followed by hospice for metastatic colon cancer. She is expected to undergo a therapeutic thoracentesis this morning and plans to d/c back to ALF later today.   Body mass index is 23.13 kg/(m^2). Patient meets criteria for normal weight range based on current BMI.   Current diet order is regular, patient is consuming approximately 50-75% of meals at this time. Labs and medications reviewed.   No nutrition interventions warranted at this time. If nutrition issues arise, please consult RD.   Tamara Mitchell A. Jimmye Norman, RD, LDN Pager: [phone]

## 2014-10-17 NOTE — Plan of Care (Signed)
Problem: Discharge Progression Outcomes Goal: Dyspnea controlled Outcome: Adequate for Discharge Hospice pt Goal: Independent ADLs or Home Health Care Outcome: Completed/Met Date Met:  10/17/14 hospice

## 2014-10-17 NOTE — Progress Notes (Signed)
Placed patient on NRB mask as was unable to get her oxygen saturation over 88. Patient saturation increased to 99 will titrate as possible.

## 2014-10-17 NOTE — Progress Notes (Signed)
PROGRESS NOTE  GENOVA KINER OAC:[id number] DOB: [birth year] DOA: 10/15/2014 PCP: Delphina Cahill, MD  Summary: 80 year old woman with history of metastatic colon cancer, chronic hypoxic respiratory failure presenting to the emergency department after history of fall. Some conflicting documentation on admission as to whether the patient may have passed out or struck her head. Imaging revealed metastatic disease to the brain and large right-sided pleural effusion. She was admitted for further evaluation of pleural effusion.  Assessment/Plan: 1. Large right pleural effusion. Symptomatically improved status post large-volume thoracentesis. 2. Fall prior to admission. Patient reports falling in the bathroom. She denies syncope. No evidence of bony injury. 3. Chronic hypoxic respiratory failure; multifactorial, metastatic disease to the lung, pleural effusion, pulmonary hypertension, cor pulmonale. Stable. 4. History of colon cancer with metastasis to the lung and brain, followed by oncology in [place]. No further treatment planned. Patient enrolled in hospice. 5. Atrial fibrillation. Continue digoxin and atenolol. No anticoagulation. 6. Dementia. Stable. Continue Aricept.   Symptomatically improved. Hypoxia stable. Laboratory studies stable.  Discussed in detail with co-POA Darryll Capers and Bethena Roys. We reviewed all laboratory testing as well as checks x-ray findings which I discussed/showed them on the computer monitor. Also reviewed metastatic disease to the brain. POA understood cancer had spread, reports no further treatment is planned focus on comfort as desired.  Plan return to assisted living today, discussed in detail with POA  Murray Hodgkins, MD  [facility]  Pager [phone] If 7PM-7AM, please contact night-coverage at [email or web address], password [facility] 10/17/2014, 12:42 PM  LOS: 2 days   Consultants:    Procedures:  Therapeutic thoracentesis 1200 mL  removed  Antibiotics:    HPI/Subjective: Placed on nonrebreather mask overnight, does not appear that anyone was notified. Seen by physical therapy this morning. Physical therapy noted she was doing well, no follow-up recommendations.  Patient currently comfortable on nasal cannula. She has no shortness of breath or pain. She tolerated thoracentesis well. Family is at bedside.  Objective: Filed Vitals:   10/17/14 0245 10/17/14 0255 10/17/14 0725 10/17/14 1023  BP:   111/64   Pulse: 95 73 76 74  Temp:   97.9 F (36.6 C)   TempSrc:   Oral   Resp: 24 24 20    Height:      Weight:      SpO2: 88% 98% 97%     Intake/Output Summary (Last 24 hours) at 10/17/14 1242 Last data filed at 10/17/14 0947  Gross per 24 hour  Intake    720 ml  Output      0 ml  Net    720 ml     Filed Weights   10/16/14 0053  Weight: 57.38 kg (126 lb 8 oz)    Exam:     Afebrile, vital signs are stable. Stable hypoxia  Appears calm and comfortable lying flat. Speech fluent and clear.  Cardiovascular regular rate and rhythm. No murmur, rub or gallop.  Respiratory clear to auscultation bilaterally.  Data Reviewed: Repeat chest x-ray shows decreased right-sided pleural effusion. No pneumothorax.  Pertinent data:  12/19 CT head, cervical spine: Lesion within the left parietal lobe concerning for brain metastasis. No intracranial trauma. No evidence of cervical spine fracture. Pulmonary metastasis and new large right pleural effusion.  12/19 chest x-ray: Large right pleural effusion. Bilateral pulmonary nodules.  Scheduled Meds: . albuterol  2.5 mg Nebulization TID  . atenolol  50 mg Oral Daily  . digoxin  0.0625 mg Oral Daily  . donepezil  10 mg Oral QHS  .  DULoxetine  30 mg Oral Daily  . feeding supplement (ENSURE COMPLETE)  237 mL Oral BID BM  . furosemide  40 mg Oral Daily  . pantoprazole  40 mg Oral Daily  . polyethylene glycol  17 g Oral Daily  . potassium chloride  10 mEq Oral  Daily  . sodium chloride  3 mL Intravenous Q12H   Continuous Infusions:   Principal Problem:   Pleural effusion, right Active Problems:   Atrial fibrillation   Dementia   Cor pulmonale   Adenocarcinoma of rectum   Fall at home   Chronic respiratory failure with hypoxia

## 2014-10-17 NOTE — Discharge Summary (Signed)
Physician Discharge Summary  DAWNYEL LEVEN ZMC:[id number] DOB: 03-14-1934 DOA: 10/15/2014  PCP: Delphina Cahill, MD  Admit date: 10/15/2014 Discharge date: 10/17/2014  Recommendations for Outpatient Follow-up:  1. Right sided malignant pleural effusion, consider repeat chest x-ray 12/28 to follow-up thoracentesis. 2. Metastatic colon cancer, now with evidence of metastatic spread to the brain. Continue hospice care.   Follow-up Information    Follow up with Delphina Cahill, MD.   Specialty:  Internal Medicine   Why:  As needed   Contact information:    [place] [address] [phone]      Discharge Diagnoses:  1. Symptomatic large right pleural effusion, malignant 2. Fall prior to admission 3. Multifactorial chronic hypoxic respiratory failure: Metastatic disease to the lung, pulmonary hypertension, cor pulmonale. 4. History of colon cancer with metastasis to the lung, brain, no further treatment planned. 5. Atrial fibrillation. 6. Dementia.  Discharge Condition: Improved. Disposition: Return to assisted living.  Diet recommendation: Regular.  Filed Weights   10/16/14 0053  Weight: 57.38 kg (126 lb 8 oz)    History of present illness:  80 year old woman with history of metastatic colon cancer, chronic hypoxic respiratory failure presenting to the emergency department after history of fall. Some conflicting documentation on admission as to whether the patient may have passed out or struck her head. Imaging revealed metastatic disease to the brain and large right-sided pleural effusion. She was admitted for further evaluation of pleural effusion.  Hospital Course:  Ms. Midkiff had mild increased hypoxia but tolerated her pleural effusion quite well. She underwent large-volume thoracentesis without complication with symptomatic improvement. Her hypoxia stable. Plan for discharge back today to assisted living. Individual issues as below.  1. Large right pleural  effusion. Symptomatically improved status post large-volume thoracentesis. 2. Fall prior to admission. Patient reports falling in the bathroom. She denies syncope. No evidence of bony injury. Seems to be mechanical in nature. No further evaluation suggested. 3. Chronic hypoxic respiratory failure; multifactorial, metastatic disease to the lung, pleural effusion, pulmonary hypertension, cor pulmonale. Stable. 4. History of colon cancer with metastasis to the lung and brain, followed by oncology in [place]. No further treatment planned. Patient enrolled in hospice. 5. Atrial fibrillation. Continue digoxin and atenolol. No anticoagulation. 6. Dementia. Stable. Continue Aricept.   Discussed in detail with co-POA Darryll Capers and Bethena Roys. We reviewed all laboratory testing as well as checks x-ray findings which I discussed/showed them on the computer monitor. Also reviewed metastatic disease to the brain. POA understood cancer had spread, reports no further treatment is planned, she wants to focus on comfort as desired.  Discharge Instructions  Discharge Instructions    Diet general    Complete by:  As directed      Discharge instructions    Complete by:  As directed   Call physician for increased shortness of breath or worsening of condition.     Increase activity slowly    Complete by:  As directed           Current Discharge Medication List    CONTINUE these medications which have NOT CHANGED   Details  atenolol (TENORMIN) 50 MG tablet Take 50 mg by mouth daily.    benzonatate (TESSALON) 200 MG capsule Take 200 mg by mouth 2 (two) times daily as needed for cough.    digoxin (LANOXIN) 0.125 MG tablet Take 0.0625 mg by mouth daily.    donepezil (ARICEPT) 10 MG tablet Take 10 mg by mouth at bedtime.  DULoxetine (CYMBALTA) 30 MG capsule Take 30 mg by mouth daily.    furosemide (LASIX) 40 MG tablet Take 1 tablet (40 mg total) by mouth daily. Qty: 30 tablet, Refills: 2    Iron-FA-B  Cmp-C-Biot-Probiotic (FUSION PLUS) CAPS Take 1 capsule by mouth daily.    loperamide (IMODIUM) 2 MG capsule Take 2 mg by mouth as needed for diarrhea or loose stools.    LORazepam (ATIVAN) 0.5 MG tablet Take 0.25 mg by mouth every 4 (four) hours as needed for anxiety.    magnesium citrate SOLN Take 0.5 mLs by mouth daily.    pantoprazole (PROTONIX) 40 MG tablet Take 40 mg by mouth daily.    polyethylene glycol (MIRALAX / GLYCOLAX) packet Take 17 g by mouth daily.    potassium chloride (K-DUR) 10 MEQ tablet Take 1 tablet (10 mEq total) by mouth daily. Qty: 30 tablet, Refills: 2    prochlorperazine (COMPAZINE) 10 MG tablet Take 10 mg by mouth every 6 (six) hours as needed for nausea or vomiting.    acetaminophen (TYLENOL) 500 MG tablet Take 500 mg by mouth every 6 (six) hours as needed for mild pain.     fluticasone (FLONASE) 50 MCG/ACT nasal spray Place 2 sprays into the nose daily as needed for allergies.        No Known Allergies  The results of significant diagnostics from this hospitalization (including imaging, microbiology, ancillary and laboratory) are listed below for reference.    Significant Diagnostic Studies: Dg Chest 1 View  10/17/2014   CLINICAL DATA:  Colon carcinoma, pulmonary metastases, RIGHT pleural effusion post thoracentesis  EXAM: CHEST - 1 VIEW  COMPARISON:  10/15/2014  FINDINGS: Decrease in RIGHT pleural effusion and basilar atelectasis post RIGHT thoracentesis.  No pneumothorax.  Enlargement of cardiac silhouette with pulmonary vascular congestion.  BILATERAL pulmonary nodular foci compatible with pulmonary metastases.  Bones diffusely demineralized.  IMPRESSION: No pneumothorax following RIGHT thoracentesis.  Multiple pulmonary metastases with decreased RIGHT pleural effusion and basilar atelectasis since prior exam.  Enlargement of cardiac silhouette with pulmonary vascular congestion.   Electronically Signed   By: Lavonia Dana M.D.   On: 10/17/2014 16:19   Dg  Chest 2 View  10/15/2014   CLINICAL DATA:  Initial evaluation for fall, trauma.  EXAM: CHEST  2 VIEW  COMPARISON:  Prior CT from 08/17/2014  FINDINGS: Cardiomegaly is stable from prior. Mediastinal silhouette within normal limits.  A large right pleural effusion extending of the lateral right hemi thorax with capping at the right lung apex is present. Hazy opacity throughout the right lung likely reflects effusion and/or atelectasis. Multiple bilateral pulmonary nodules are present, better evaluated on recent chest CT. The no pneumothorax. No definite pulmonary edema or focal infiltrate.  No acute osseus abnormality.  IMPRESSION: 1. No acute traumatic injury within the chest. 2. Large right pleural effusion. Hazy opacity within the aerated portion of the right lung favored to reflect effusion and/or atelectasis. 3. Bilateral pulmonary nodules, grossly similar relative to prior CT. 4. Cardiomegaly.   Electronically Signed   By: Jeannine Boga M.D.   On: 10/15/2014 20:49   Dg Pelvis 1-2 Views  10/15/2014   CLINICAL DATA:  Initial evaluation for acute trauma.  Fall.  EXAM: PELVIS - 1-2 VIEW  COMPARISON:  None.  FINDINGS: No acute fracture or dislocation. The femoral heads are normally aligned within the acetabula. Femoral head heights are maintained. Advanced degenerative osteoarthrosis noted about both hips. Bony pelvis is intact. SI joints are approximated. No  pubic diastasis.  No soft tissue abnormality.  Prominent degenerative changes noted within the lower lumbar spine parent  IMPRESSION: 1. No acute fracture or dislocation. 2. Advanced degenerative osteoarthrosis about the hips bilaterally.   Electronically Signed   By: Jeannine Boga M.D.   On: 10/15/2014 20:44   Ct Head Wo Contrast  10/15/2014   CLINICAL DATA:  Unwitnessed fall at nursing home. History of colon cancer with lung metastasis.  EXAM: CT HEAD WITHOUT CONTRAST  CT CERVICAL SPINE WITHOUT CONTRAST  TECHNIQUE: Multidetector CT  imaging of the head and cervical spine was performed following the standard protocol without intravenous contrast. Multiplanar CT image reconstructions of the cervical spine were also generated.  COMPARISON:  CT thorax 08/17/2014  FINDINGS: CT HEAD FINDINGS  There is a round hyperdense 11 mm lesion in the a deep white matter of the left parietal lobe adjacent to the ventricle. This is felt to be within the brain parenchyma rather than the intraventricular lesion or epididymal lesion.  There is no intracranial hemorrhage. No CT evidence of acute cortical infarction. No midline shift or mass effect. There is generalized cortical atrophy and proportional ventricular dilatation.  Basal cisterns are patent.  Paranasal sinuses and  mastoid air cells are clear.  CT CERVICAL SPINE FINDINGS  Exam is degraded by patient motion.  No prevertebral soft tissue swelling. Normal alignment of cervical vertebral bodies. No loss of vertebral body height. Normal facet articulation. Normal craniocervical junction.  There is multiple levels of endplate degeneration with spurring and joint space narrowing and sclerosis. There is no acute subluxation or fracture evident.  No evidence epidural or paraspinal hematoma.  Limited view of the lung apices demonstrates a pulmonary metastasis in left upper lobe. Large effusion on the right which is new from prior CT.  IMPRESSION: 1. Hyperdense lesion within the left parietal lobe white matter adjacent to the ventricle is most concerning for a brain metastasis. This could be confirmed with non emergent MRI of the brain with contrast. 2. No intracranial trauma. 3. Atrophy and ventricular dilatation. 4. No evidence of cervical spine fracture on exam degraded by patient motion. 5. Multiple levels of degenerative endplate change. 6. Pulmonary metastasis and large new  right effusion. Findings conveyed toKATHLEEN MCMANUS on 10/15/2014  at21:26.   Electronically Signed   By: Suzy Bouchard M.D.   On:  10/15/2014 21:27   Ct Cervical Spine Wo Contrast  10/15/2014   CLINICAL DATA:  Unwitnessed fall at nursing home. History of colon cancer with lung metastasis.  EXAM: CT HEAD WITHOUT CONTRAST  CT CERVICAL SPINE WITHOUT CONTRAST  TECHNIQUE: Multidetector CT imaging of the head and cervical spine was performed following the standard protocol without intravenous contrast. Multiplanar CT image reconstructions of the cervical spine were also generated.  COMPARISON:  CT thorax 08/17/2014  FINDINGS: CT HEAD FINDINGS  There is a round hyperdense 11 mm lesion in the a deep white matter of the left parietal lobe adjacent to the ventricle. This is felt to be within the brain parenchyma rather than the intraventricular lesion or epididymal lesion.  There is no intracranial hemorrhage. No CT evidence of acute cortical infarction. No midline shift or mass effect. There is generalized cortical atrophy and proportional ventricular dilatation.  Basal cisterns are patent.  Paranasal sinuses and  mastoid air cells are clear.  CT CERVICAL SPINE FINDINGS  Exam is degraded by patient motion.  No prevertebral soft tissue swelling. Normal alignment of cervical vertebral bodies. No loss of vertebral body height.  Normal facet articulation. Normal craniocervical junction.  There is multiple levels of endplate degeneration with spurring and joint space narrowing and sclerosis. There is no acute subluxation or fracture evident.  No evidence epidural or paraspinal hematoma.  Limited view of the lung apices demonstrates a pulmonary metastasis in left upper lobe. Large effusion on the right which is new from prior CT.  IMPRESSION: 1. Hyperdense lesion within the left parietal lobe white matter adjacent to the ventricle is most concerning for a brain metastasis. This could be confirmed with non emergent MRI of the brain with contrast. 2. No intracranial trauma. 3. Atrophy and ventricular dilatation. 4. No evidence of cervical spine fracture on  exam degraded by patient motion. 5. Multiple levels of degenerative endplate change. 6. Pulmonary metastasis and large new  right effusion. Findings conveyed toKATHLEEN MCMANUS on 10/15/2014  at21:26.   Electronically Signed   By: Suzy Bouchard M.D.   On: 10/15/2014 21:27   Dg Shoulder Left  10/15/2014   CLINICAL DATA:  Patient status post fall.  EXAM: LEFT SHOULDER - 2+ VIEW  COMPARISON:  None.  FINDINGS: Examination limited secondary to difficulty with patient positioning. Within the above limitation, no definite evidence for acute fracture or dislocation. AC joint degenerative change. Re- demonstrated in 1.2 cm left upper lobe nodule, visualized on prior chest CT 08/17/2014.  IMPRESSION: Limited exam.  No definite evidence for acute fracture or dislocation.   Electronically Signed   By: Lovey Newcomer M.D.   On: 10/15/2014 20:50   US Thoracentesis Asp Pleural Space W/img Guide  10/17/2014   CLINICAL DATA:  Metastatic colon cancer, large RIGHT pleural effusion, shortness of breath  EXAM: ULTRASOUND GUIDED THERAPEUTIC THORACENTESIS  COMPARISON:  None; correlation chest radiograph 10/15/2014  PROCEDURE: Procedure, benefits, and risks of procedure were discussed with patient.  Written informed consent for procedure was obtained.  Time out protocol followed.  Pleural effusion localized by ultrasound at the posterior RIGHT hemithorax.  Skin prepped and draped in usual sterile fashion.  Skin and soft tissues anesthetized with 10 mL of 1% lidocaine.  8 French thoracentesis catheter placed into the RIGHT pleural space.  1200 mL of dark amber fluid aspirated by syringe pump.  Procedure tolerated well by patient without immediate complication.  FINDINGS: As above  IMPRESSION: Successful ultrasound guided RIGHT thoracentesis yielding 1200 mL of pleural fluid.   Electronically Signed   By: Lavonia Dana M.D.   On: 10/17/2014 16:22    Microbiology: Recent Results (from the past 240 hour(s))  MRSA PCR Screening      Status: None   Collection Time: 10/16/14  1:13 AM  Result Value Ref Range Status   MRSA by PCR NEGATIVE NEGATIVE Final    Comment:        The GeneXpert MRSA Assay (FDA approved for NASAL specimens only), is one component of a comprehensive MRSA colonization surveillance program. It is not intended to diagnose MRSA infection nor to guide or monitor treatment for MRSA infections.      Labs: Basic Metabolic Panel:  Recent Labs Lab 10/15/14 2208 10/16/14 0633  NA 141 144  K 4.2 4.4  CL 96 100  CO2 31 32  GLUCOSE 134* 109*  BUN 27* 28*  CREATININE 0.87 0.80  CALCIUM 9.9 9.6   Liver Function Tests:  Recent Labs Lab 10/16/14 0633  AST 59*  ALT 21  ALKPHOS 249*  BILITOT 1.6*  PROT 6.2  ALBUMIN 2.4*   CBC:  Recent Labs Lab 10/15/14 2208 10/16/14  0315  WBC 7.2 8.6  NEUTROABS 5.9  --   HGB 13.7 13.5  HCT 45.5 43.9  MCV 98.7 98.7  PLT 258 290   Cardiac Enzymes:  Recent Labs Lab 10/15/14 2208  TROPONINI <0.30     Recent Labs  04/06/14 0538 08/17/14 1855 10/15/14 2208  PROBNP 2663.0* 10010.0* 8287.0*    Principal Problem:   Pleural effusion, right Active Problems:   Atrial fibrillation   Dementia   Cor pulmonale   Adenocarcinoma of rectum   Fall at home   Chronic respiratory failure with hypoxia   Time coordinating discharge: 35 minutes  Signed:  Murray Hodgkins, MD [facility] 10/17/2014, 5:16 PM

## 2014-10-17 NOTE — Clinical Social Work Psychosocial (Signed)
Clinical Social Work Department BRIEF PSYCHOSOCIAL ASSESSMENT 10/17/2014  Patient:  Tamara Mitchell,Tamara Mitchell     Account Number:  [id number]     Admit date:  10/15/2014  Clinical Social Worker:  Tamara Mitchell  Date/Time:  10/17/2014 09:30 AM  Referred by:  CSW  Date Referred:  10/17/2014 Referred for  ALF Placement   Other Referral:   Interview type:  Patient Other interview type:    PSYCHOSOCIAL DATA Living Status:  FACILITY Admitted from facility:  Other Level of care:   Primary support name:  Tamara Mitchell Primary support relationship to patient:  FAMILY Degree of support available:   supportive per pt    CURRENT CONCERNS Current Concerns  Post-Acute Placement   Other Concerns:    SOCIAL WORK ASSESSMENT / PLAN CSW met with pt at bedside. Pt alert and oriented and reports she is feeling much better this morning. She said she has been a resident at [facility] or [facility] for about 2 months now. Prior to this, pt was living alone and unable to continue to care for herself. Pt reports she was diagnosed with cancer in the spring and completed treatment. She started on hospice with [facility] about a month ago. She has two cousins, Tamara Mitchell and Tamara Mitchell who are involved and visit often. Pt states she has adjusted very well to being at [facility] and admits that this surprised her. Per [name] at facility, pt was relatively independent until about 2 weeks ago. She now requires assist with all ADLs and they have ordered a wheelchair and hospital bed for pt. She fell over weekend, hitting her head, so facility sent her to ED for evaluation. Pt is on 2 liters oxygen there. [name] indicates they can continue to meet pt's needs along with hospice support.   Assessment/plan status:  Psychosocial Support/Ongoing Assessment of Needs Other assessment/ plan:   Information/referral to community resources:   Tamara Mitchell of [employer]    PATIENT'S/FAMILY'S RESPONSE TO PLAN OF CARE: Pt reports Tamara Mitchell is a  "wonderful place to be." She plans to return at d/c. CSW will continue to follow.       Tamara Mitchell, Tamara Mitchell

## 2014-10-17 NOTE — Evaluation (Signed)
Physical Therapy Evaluation Patient Details Name: Tamara Mitchell MRN: [id number] DOB: 12/19/1933 Today's Date: 10/17/2014   History of Present Illness  80 year old female who  has a past medical history of Essential hypertension, benign; Permanent atrial fibrillation; Mild dementia; Mitral insufficiency; Tricuspid insufficiency; Pulmonary hypertension; Chronic venous insufficiency; History of nuclear stress test (12/12/05/2010); Depression; GI bleed; Rectal polyp; Colon cancer metastasized to lung; DNR (do not resuscitate); and On home O2.  She was admitted after a fall at [facility].  She is currently on Hospice service for colorectal CA and is not receiving any further treatement.  She was found to have a large right pleural effusion as well as metastasis to the left parietal lobe of the brain.  She has baseline mild dementia.  Clinical Impression   Pt was seen for evaluation this AM.  She was alert and oriented and on 3.5 L O2 with O2 sat =91%.  She was cooperative and able to follow all directions.  She is significantly deconditioned but only needed min assist to transfer out of bed to a walker.  She was able to ambulate 3' to a chair using a walker.  From her report, this is probably close to her functional baseline and she should be able to transition to [facility] at d/c.    Follow Up Recommendations No PT follow up    Equipment Recommendations  None recommended by PT    Recommendations for Other Services  none     Precautions / Restrictions Precautions Precautions: Fall Restrictions Weight Bearing Restrictions: No      Mobility  Bed Mobility Overal bed mobility: Needs Assistance Bed Mobility: Supine to Sit     Supine to sit: Min assist        Transfers Overall transfer level: Needs assistance Equipment used: Rolling walker (2 wheeled) Transfers: Sit to/from Stand Sit to Stand: Min assist            Ambulation/Gait Ambulation/Gait assistance: Min guard Ambulation Distance  (Feet): 3 Feet Assistive device: Rolling walker (2 wheeled) Gait Pattern/deviations: Trunk flexed;Shuffle   Gait velocity interpretation: Below normal speed for age/gender    Stairs            Wheelchair Mobility    Modified Rankin (Stroke Patients Only)       Balance Overall balance assessment: Needs assistance Sitting-balance support: No upper extremity supported;Feet supported Sitting balance-Leahy Scale: Fair Sitting balance - Comments: tends to fall backward   Standing balance support: Bilateral upper extremity supported Standing balance-Leahy Scale: Fair                               Pertinent Vitals/Pain Pain Assessment: No/denies pain    Home Living Family/patient expects to be discharged to:: Assisted living               Home Equipment: Grab bars - tub/shower;Toilet riser;Other (comment);Cane - single point;Bedside commode;Walker - 4 wheels      Prior Function Level of Independence: Needs assistance   Gait / Transfers Assistance Needed: pt able to take a few steps with a walker, min assist for transfers  ADL's / Homemaking Assistance Needed: assist with all ADLs        Hand Dominance   Dominant Hand: Left    Extremity/Trunk Assessment               Lower Extremity Assessment: Generalized weakness      Cervical / Trunk Assessment: Kyphotic  Communication   Communication: No difficulties  Cognition Arousal/Alertness: Awake/alert Behavior During Therapy: WFL for tasks assessed/performed Overall Cognitive Status: Within Functional Limits for tasks assessed                      General Comments      Exercises        Assessment/Plan    PT Assessment Patent does not need any further PT services  PT Diagnosis Difficulty walking;Generalized weakness   PT Problem List    PT Treatment Interventions     PT Goals (Current goals can be found in the Care Plan section) Acute Rehab PT Goals PT Goal  Formulation: All assessment and education complete, DC therapy    Frequency     Barriers to discharge  none      Co-evaluation               End of Session Equipment Utilized During Treatment: Gait belt Activity Tolerance: Patient tolerated treatment well Patient left: in chair;with call bell/phone within reach;with family/visitor present Nurse Communication: Mobility status         Time: 5102-5852 PT Time Calculation (min) (ACUTE ONLY): 26 min   Charges:   PT Evaluation $Initial PT Evaluation Tier I: 1 Procedure     PT G CodesDemetrios Isaacs L 10/17/2014, 9:16 AM

## 2014-10-17 NOTE — Progress Notes (Signed)
Discharged to [facility] via their staff report called to staff

## 2014-10-18 NOTE — Clinical Social Work Note (Signed)
Late entry: Pt d/c yesterday back to [place]. RN called facility for transport.   Benay Pike, [place]

## 2014-10-28 DEATH — deceased

## 2015-06-03 IMAGING — CR DG CHEST 1V
1 series · 1 of 1 positions shown · non-contrast
Comparison: 05/06/2013

CLINICAL DATA: Pneumonia, hypertension, atrial fibrillation, mitral
and tricuspid valvular insufficiency

EXAM:
CHEST - 1 VIEW

[view not recorded]
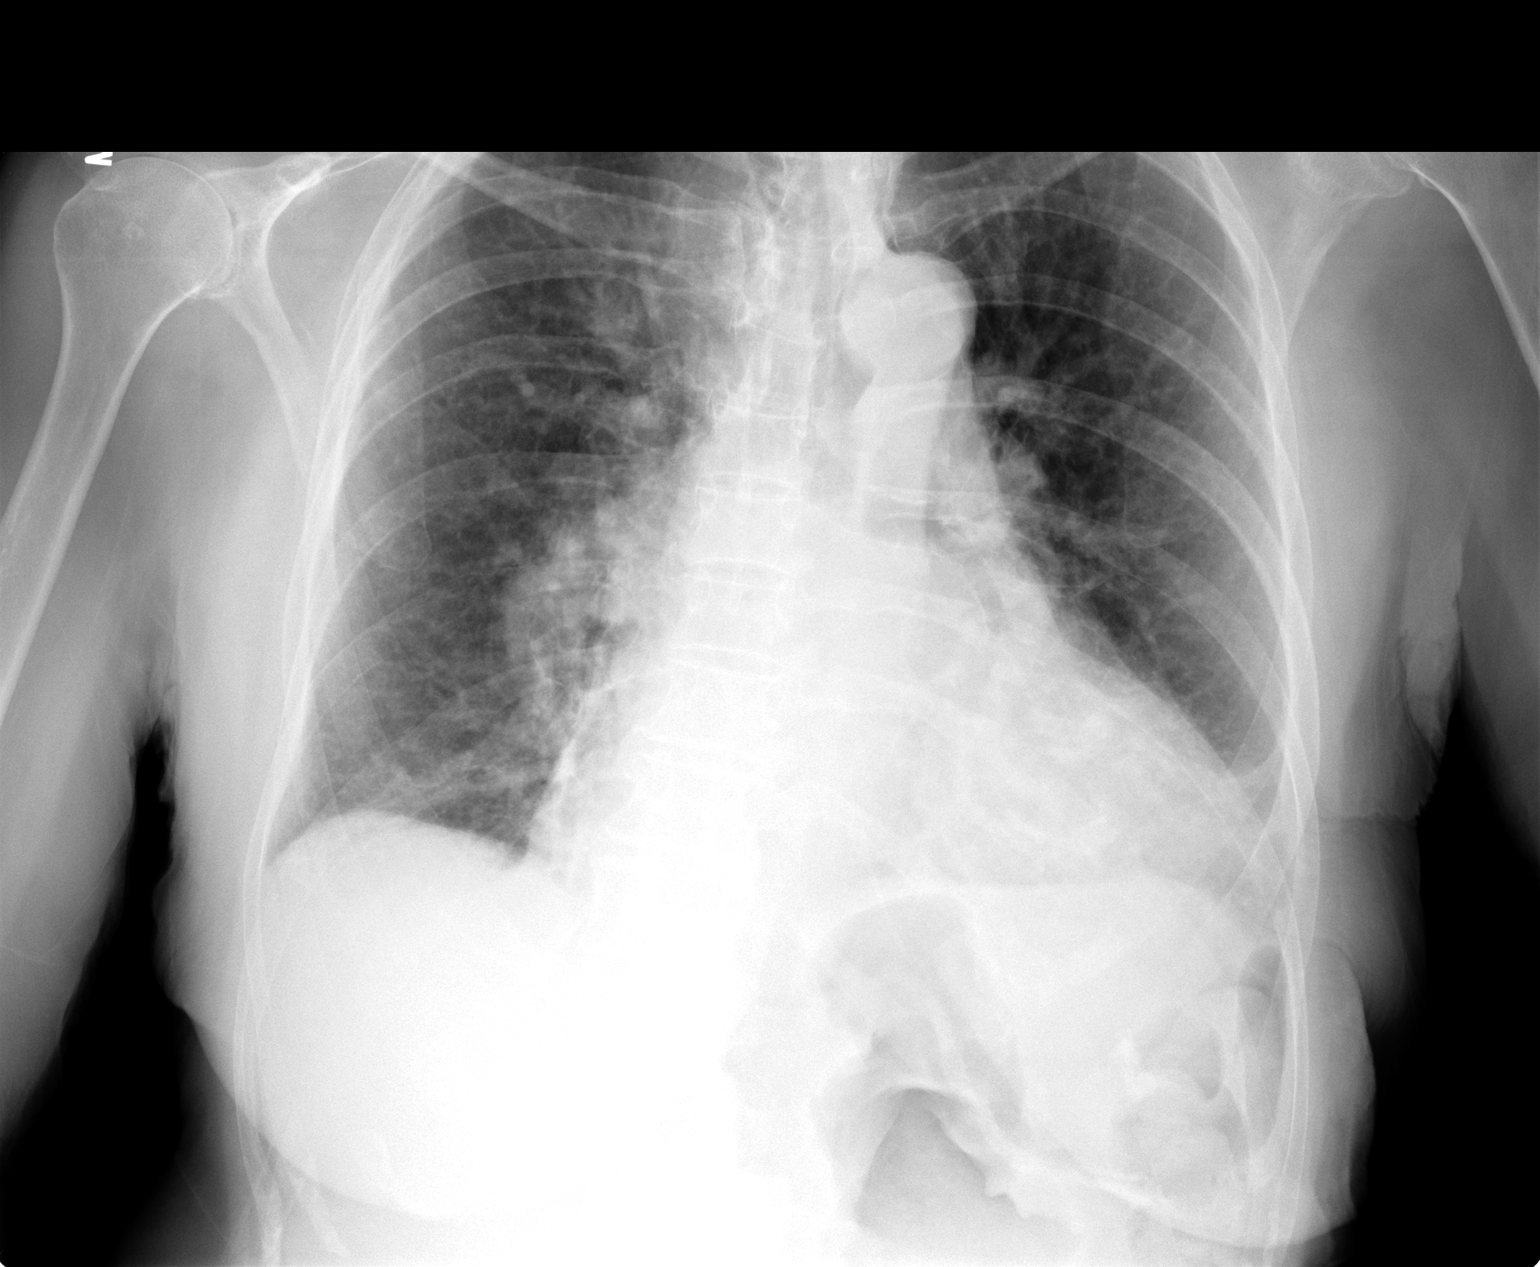

[1 of 1 positions shown; findings below may reference images not displayed]

FINDINGS: Enlargement of cardiac silhouette.

Mediastinal contours normal.

Pulmonary vascularity normal.

Mild bronchitic changes with minimal bibasilar atelectasis.

No definite acute infiltrate, pleural effusion or pneumothorax.

Bones demineralized with thoracolumbar scoliosis.
IMPRESSION: Enlargement of cardiac silhouette.

Bronchitic changes with minimal bibasilar atelectasis.

No acute infiltrate.

## 2015-06-07 IMAGING — CR DG CHEST 2V
2 series · 2 of 2 positions shown · non-contrast
Comparison: DG CHEST 1 VIEW dated 12/20/2013; DG CHEST 2 VIEW dated
05/06/2013

CLINICAL DATA: Cough, fever, congestion

EXAM:
CHEST  2 VIEW

[view not recorded (1 of 2)]
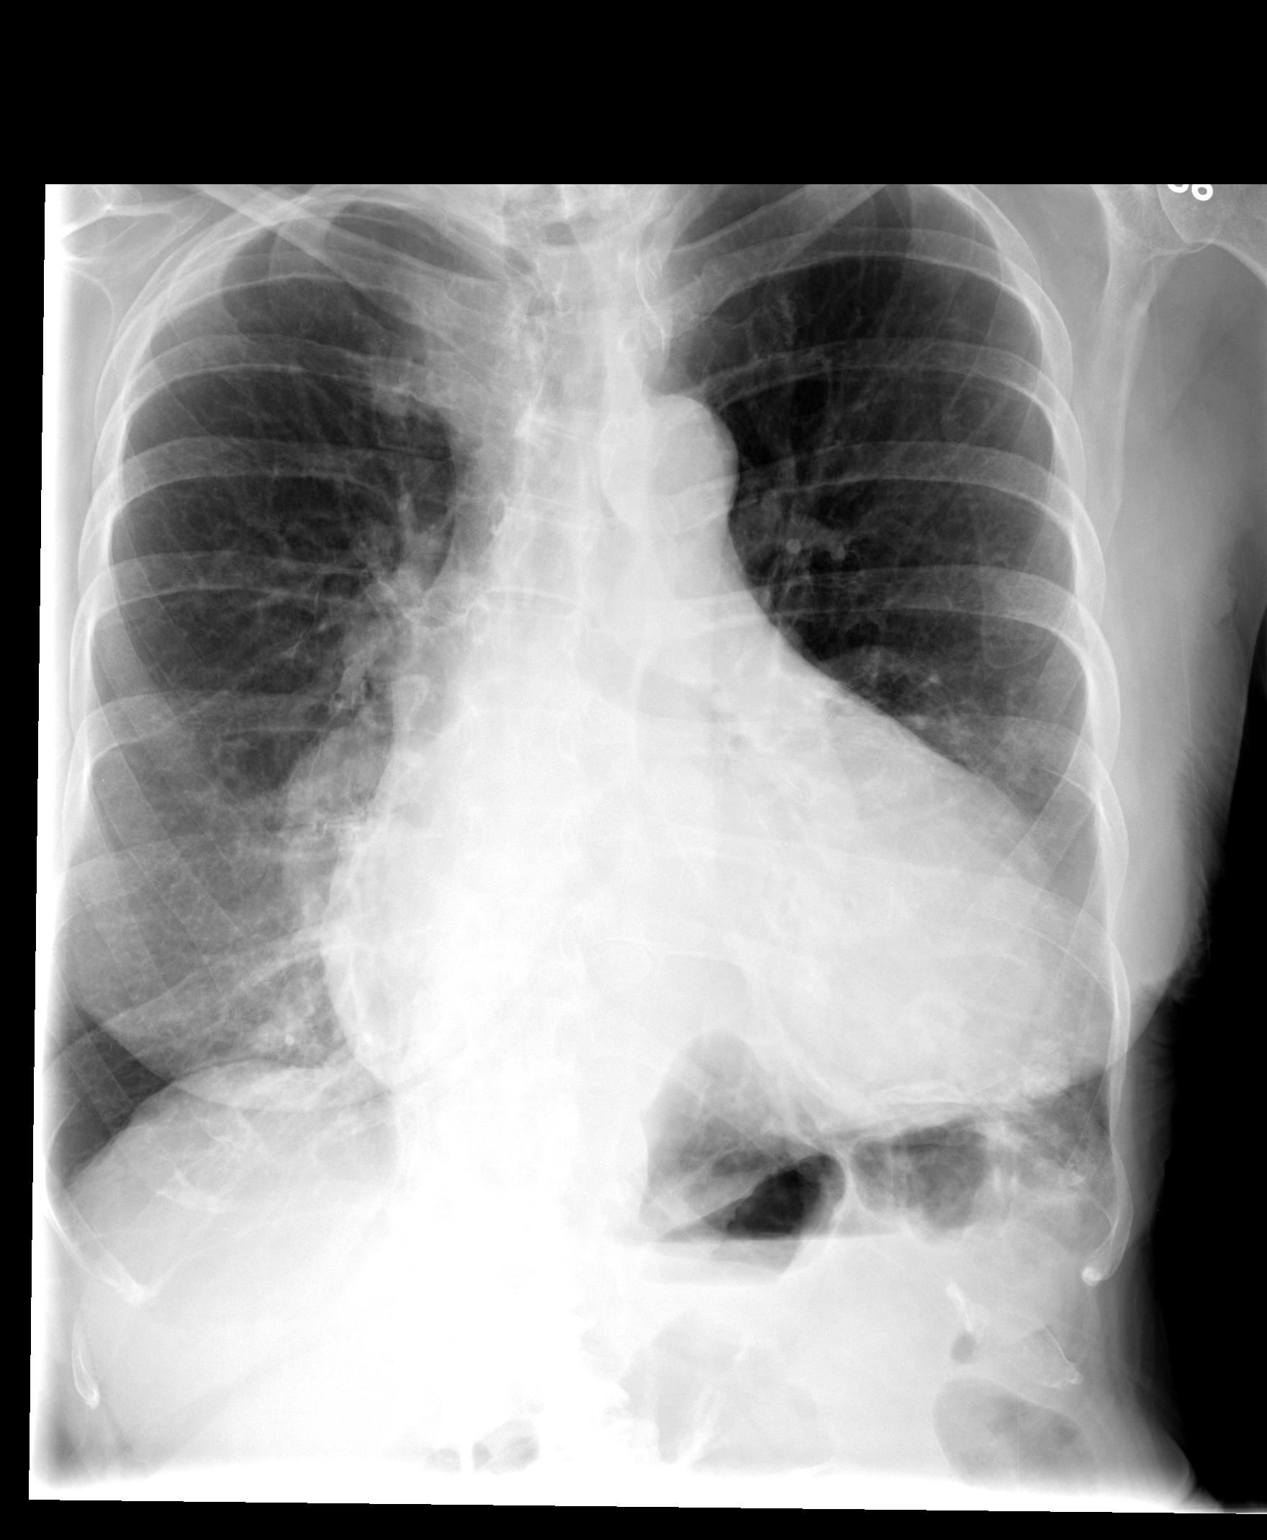

[view not recorded (2 of 2)]
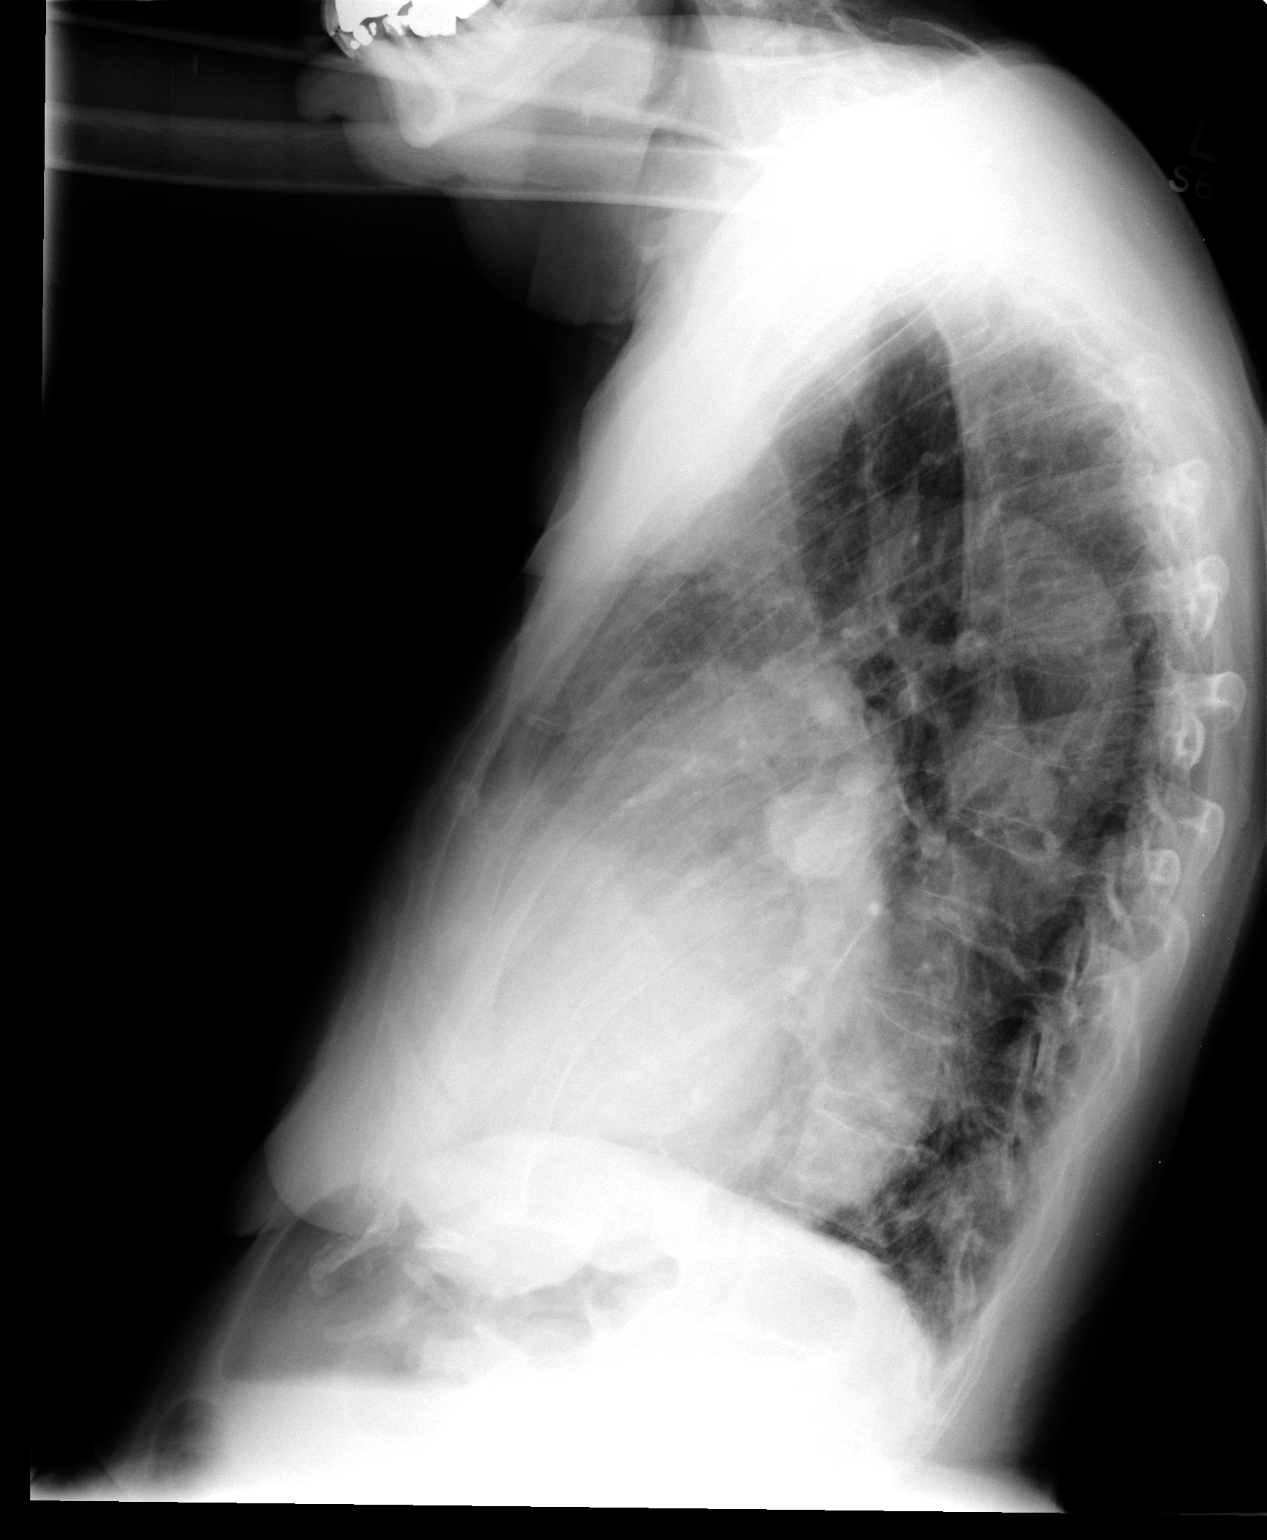

[2 of 2 positions shown; findings below may reference images not displayed]

FINDINGS: Re- demonstrated marked cardiomegaly. No large consolidative
pulmonary opacity. No pleural effusion or pneumothorax. No
aggressive or acute appearing osseous lesions. Suspected nodular
density projecting over the left lower hemi thorax may represent
nipple shadow.
IMPRESSION: Cardiomegaly.

No acute cardiopulmonary process.

Nodular density projecting over the left lower hemi thorax may
represent nipple shadow. Consider repeat evaluation with nipple
markers.

## 2015-09-15 IMAGING — CR DG CHEST 2V
2 series · 2 of 2 positions shown · non-contrast
Comparison: 01/04/2014 and 12/24/2013

CLINICAL DATA: Shortness of breath.

EXAM:
CHEST  2 VIEW

[view not recorded (1 of 2)]
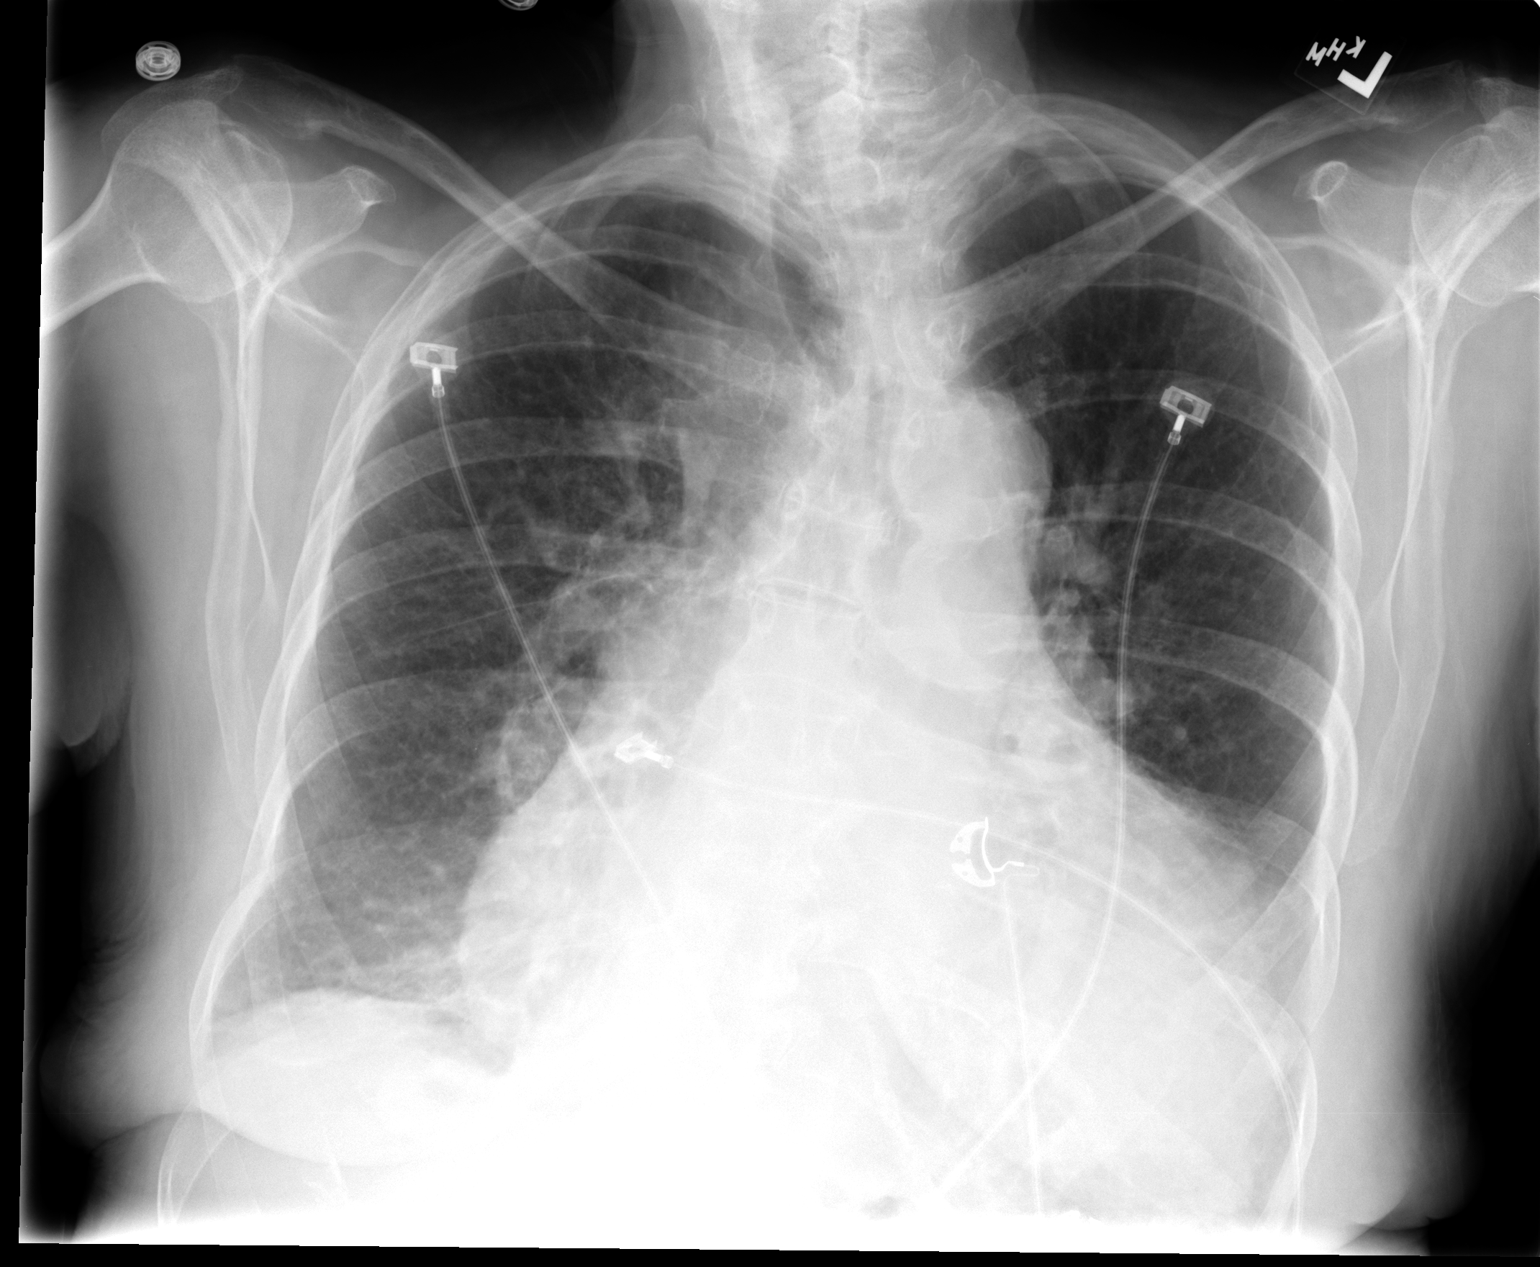

[view not recorded (2 of 2)]
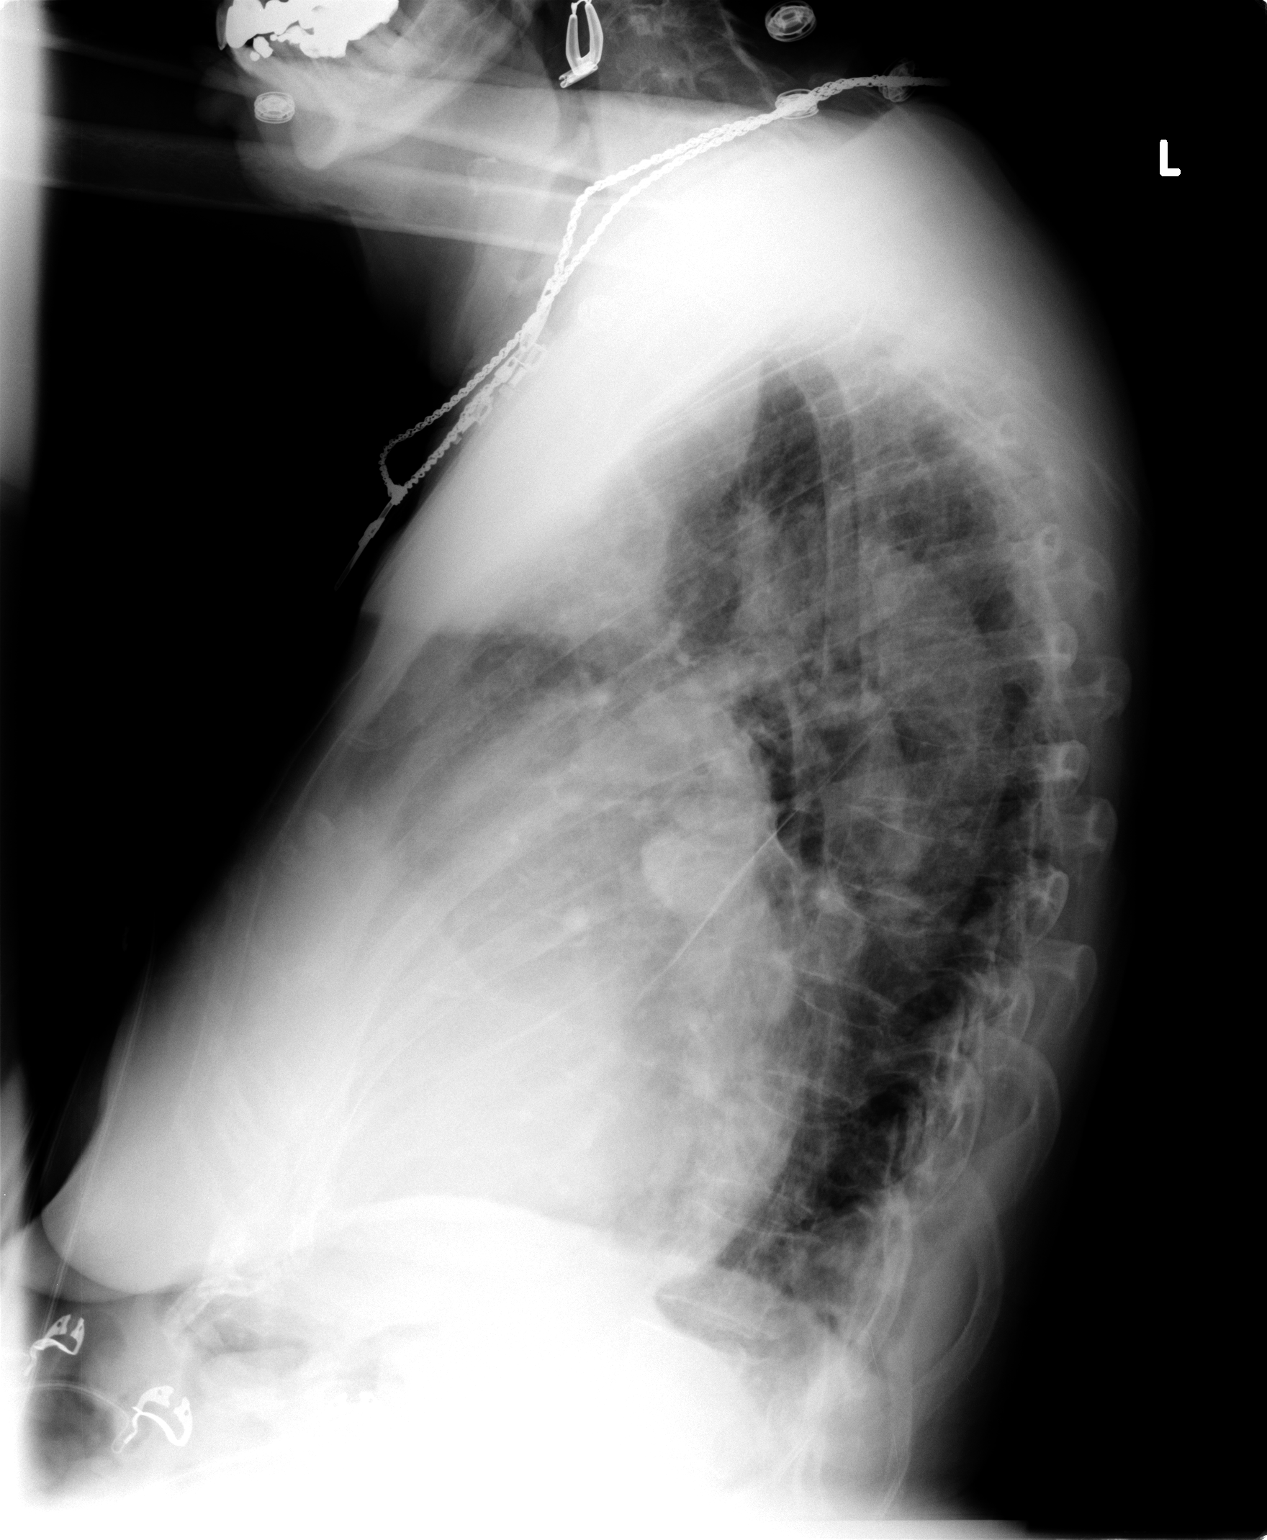

[2 of 2 positions shown; findings below may reference images not displayed]

FINDINGS: Lungs are adequately inflated without focal consolidation or
effusion. There is minimal the right per mediastinal density
unchanged likely due in part to patient's rotation towards the
right. There is moderate to severe stable cardiomegaly. Remainder
the exam is unchanged.
IMPRESSION: Moderate to severe stable cardiomegaly. No acute cardiopulmonary
disease.

## 2024-09-30 ENCOUNTER — Other Ambulatory Visit (HOSPITAL_COMMUNITY): Payer: Self-pay
